# Patient Record
Sex: Female | Born: 1960 | Race: White | Hispanic: No | Marital: Married | Smoking: Former smoker
Health system: Southern US, Community
[De-identification: ages and names within clinical notes are randomized; demographics above are authoritative.]

## PROBLEM LIST (undated history)

## (undated) DIAGNOSIS — J3800 Paralysis of vocal cords and larynx, unspecified: Secondary | ICD-10-CM

## (undated) DIAGNOSIS — Z5189 Encounter for other specified aftercare: Secondary | ICD-10-CM

## (undated) DIAGNOSIS — I639 Cerebral infarction, unspecified: Secondary | ICD-10-CM

## (undated) HISTORY — DX: Cerebral infarction, unspecified: I63.9

## (undated) HISTORY — PX: COLONOSCOPY: SHX174

## (undated) HISTORY — DX: Encounter for other specified aftercare: Z51.89

## (undated) HISTORY — PX: OOPHORECTOMY: SHX86

## (undated) HISTORY — PX: OTHER SURGICAL HISTORY: SHX169

## (undated) HISTORY — DX: Paralysis of vocal cords and larynx, unspecified: J38.00

---

## 2008-04-06 LAB — HM COLONOSCOPY: HM Colonoscopy: NORMAL

## 2014-06-25 LAB — HM MAMMOGRAPHY

## 2014-07-02 LAB — BASIC METABOLIC PANEL
BUN: 8 mg/dL (ref 4–21)
Creatinine: 0.6 mg/dL (ref <=1.1)
GLUCOSE: 83 mg/dL
Potassium: 4.5 mmol/L (ref 3.4–5.3)
Sodium: 142 mmol/L (ref 137–147)

## 2014-07-02 LAB — LIPID PANEL
Cholesterol: 172 mg/dL (ref 0–200)
HDL: 67 mg/dL (ref 35–70)
LDL CALC: 93 mg/dL
Triglycerides: 60 mg/dL (ref 40–160)

## 2014-07-02 LAB — CBC AND DIFFERENTIAL
HEMATOCRIT: 39 % (ref 36–46)
HEMOGLOBIN: 12.1 g/dL (ref 12.0–16.0)
Platelets: 155 10*3/uL (ref 150–399)
WBC: 4.3 10*3/mL

## 2014-10-29 ENCOUNTER — Ambulatory Visit (INDEPENDENT_AMBULATORY_CARE_PROVIDER_SITE_OTHER): Payer: PRIVATE HEALTH INSURANCE | Admitting: Emergency Medicine

## 2014-10-29 VITALS — BP 96/64 | HR 81 | Temp 98.8°F | Resp 16 | Ht 63.0 in | Wt 125.2 lb

## 2014-10-29 DIAGNOSIS — J209 Acute bronchitis, unspecified: Secondary | ICD-10-CM

## 2014-10-29 MED ORDER — HYDROCOD POLST-CPM POLST ER 10-8 MG/5ML PO SUER: 5.0000 mL | Freq: Two times a day (BID) | ORAL | Status: DC

## 2014-10-29 MED ORDER — CLARITHROMYCIN 250 MG/5ML PO SUSR: 500.0000 mg | Freq: Two times a day (BID) | ORAL | Status: DC

## 2014-10-29 NOTE — Progress Notes (Signed)
Subjective:  Patient ID: Diane Stone, female    DOB: [birth year]  Age: 54 y.o. MRN: [id number]  CC: Cough   HPI Diane Stone presents  with a cough. She has a fever and feels all severe malaise and fatigue. She has no wheezing or shortness of breath. She has cough productive purulent sputum. Nasal congestion is watery in character. 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 Difficulty swallowing and handling secretions. She denies any nausea vomiting or stool change.  History Diane Stone has a past medical history of Stroke [facility]).   She has past surgical history that includes Cesarean section and Oophorectomy (N/A).   Her  family history is not on file.  She   reports that she has never smoked. She does not have any smokeless tobacco history on file. Her alcohol and drug histories are not on file.  No outpatient prescriptions prior to visit.   No facility-administered medications prior to visit.    Social History   Social History  . Marital Status: Married    Spouse Name: N/A  . Number of Children: N/A  . Years of Education: N/A   Social History Main Topics  . Smoking status: Never Smoker   . Smokeless tobacco: None  . Alcohol Use: None  . Drug Use: None  . Sexual Activity: Not Asked   Other Topics Concern  . None   Social History Narrative  . None     Review of Systems  Constitutional: Positive for chills and fatigue. Negative for fever and appetite change.  HENT: Positive for rhinorrhea and voice change. Negative for congestion, ear pain, postnasal drip, sinus pressure and sore throat.   Eyes: Negative for pain and redness.  Respiratory: Positive for cough. Negative for shortness of breath and wheezing.   Cardiovascular: Negative for leg swelling.  Gastrointestinal: Negative for nausea, vomiting, abdominal pain, diarrhea, constipation and blood in stool.  Endocrine: Negative for  polyuria.  Genitourinary: Negative for dysuria, urgency, frequency and flank pain.  Musculoskeletal: Negative for gait problem.  Skin: Negative for rash.  Neurological: Negative for weakness and headaches.  Psychiatric/Behavioral: Negative for confusion and decreased concentration. The patient is not nervous/anxious.     Objective:  BP 96/64 mmHg  Pulse 81  Temp(Src) 98.8 F (37.1 C) (Oral)  Resp 16  Ht  (1.6 m)  Wt 125 lb 3.2 oz (56.79 kg)  BMI 22.18 kg/m2  SpO2 98%  LMP 10/11/2014  Physical Exam  Constitutional: She is oriented to person, place, and time. She appears well-developed and well-nourished. No distress.  HENT:  Head: Normocephalic and atraumatic.  Right Ear: External ear normal.  Left Ear: External ear normal.  Nose: Nose normal.  Eyes: Conjunctivae and EOM are normal. Pupils are equal, round, and reactive to light. No scleral icterus.  Neck: Normal range of motion. Neck supple. No tracheal deviation present.  Cardiovascular: Normal rate, regular rhythm and normal heart sounds.   Pulmonary/Chest: Effort normal. No respiratory distress. She has no wheezes. She has no rales.  Abdominal: She exhibits no mass. There is no tenderness. There is no rebound and no guarding.  Musculoskeletal: She exhibits no edema.  Lymphadenopathy:    She has no cervical adenopathy.  Neurological: She is alert and oriented to person, place, and time. Coordination normal.  Skin: Skin is warm and dry. No rash noted.  Psychiatric: She has a normal mood and affect. Her behavior is normal.  Assessment & Plan:   Diane Stone seen today for cough.  Diagnoses and all orders for this visit:  Acute bronchitis, unspecified organism  Other orders -     chlorpheniramine-HYDROcodone (TUSSIONEX PENNKINETIC ER) 10-8 MG/5ML SUER; Take 5 mLs by mouth 2 (two) times daily. -     Discontinue: clarithromycin (BIAXIN) 250 MG/5ML suspension; Take 10 mLs (500 mg total) by mouth 2 (two) times  daily.  I have discontinued Diane Stone clarithromycin. I am also having her start on chlorpheniramine-HYDROcodone. Additionally, I am having her maintain her aspirin EC, ferrous sulfate, and pediatric multivitamin-fluoride.  Meds ordered this encounter  Medications  . aspirin EC 81 MG tablet    Sig: Take 81 mg by mouth daily.  . ferrous sulfate 325 (65 FE) MG tablet    Sig: Take 325 mg by mouth daily with breakfast.  . pediatric multivitamin-fluoride (POLY-VI-FLOR) 0.25 MG chewable tablet    Sig: Chew 1 tablet by mouth daily.  . chlorpheniramine-HYDROcodone (TUSSIONEX PENNKINETIC ER) 10-8 MG/5ML SUER    Sig: Take 5 mLs by mouth 2 (two) times daily.    Dispense:  60 mL    Refill:  0  . DISCONTD: clarithromycin (BIAXIN) 250 MG/5ML suspension    Sig: Take 10 mLs (500 mg total) by mouth 2 (two) times daily.    Dispense:  200 mL    Refill:  0   Her prescription for Biaxin Stone canceled as the pharmacy doesn't have a liquid Biaxin so she Stone changed to Augmentin ES.  Appropriate red flag conditions were discussed with the patient as well as actions that should be taken.  Patient expressed his understanding.  Follow-up: Return if symptoms worsen or fail to improve.  Carmelina DaneAnderson, Jeffery S, MD

## 2014-10-29 NOTE — Patient Instructions (Signed)

## 2015-03-28 ENCOUNTER — Encounter: Payer: Self-pay | Admitting: General Practice

## 2015-03-29 ENCOUNTER — Ambulatory Visit (INDEPENDENT_AMBULATORY_CARE_PROVIDER_SITE_OTHER): Payer: PRIVATE HEALTH INSURANCE | Admitting: Family Medicine

## 2015-03-29 ENCOUNTER — Encounter: Payer: Self-pay | Admitting: Family Medicine

## 2015-03-29 VITALS — BP 100/72 | HR 74 | Temp 98.2°F | Resp 16 | Ht 63.0 in | Wt 122.5 lb

## 2015-03-29 DIAGNOSIS — H9192 Unspecified hearing loss, left ear: Secondary | ICD-10-CM

## 2015-03-29 DIAGNOSIS — I63231 Cerebral infarction due to unspecified occlusion or stenosis of right carotid arteries: Secondary | ICD-10-CM | POA: Diagnosis not present

## 2015-03-29 DIAGNOSIS — I63239 Cerebral infarction due to unspecified occlusion or stenosis of unspecified carotid arteries: Secondary | ICD-10-CM | POA: Insufficient documentation

## 2015-03-29 DIAGNOSIS — Z800 Family history of malignant neoplasm of digestive organs: Secondary | ICD-10-CM | POA: Diagnosis not present

## 2015-03-29 DIAGNOSIS — J3800 Paralysis of vocal cords and larynx, unspecified: Secondary | ICD-10-CM | POA: Diagnosis not present

## 2015-03-29 NOTE — Progress Notes (Signed)
Pre visit review using our clinic review tool, if applicable. No additional management support is needed unless otherwise documented below in the visit note. 

## 2015-03-29 NOTE — Progress Notes (Signed)
   Subjective:    Patient ID: Diane Stone, female    DOB: 1960-02-19, 55 y.o.   MRN: [id number]  HPI New to establish.  Previous MD- [place]  Last CPE July 2016.  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  Schwannoma was completely removed.  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  Family hx of colon cancer- sister, dx'd at age 54.  Pt's last colonoscopy was 6-7 yrs ago.  Needs repeat.  GYN- UTD on pap (no longer having these done), mammo.  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  Notes progressively diminishing hearing on this side.   Review of Systems For ROS see HPI     Objective:   Physical Exam  Constitutional: She is oriented to person, place, and time. She appears well-developed and well-nourished. No distress.  HENT:  Head: Normocephalic and atraumatic.  Nose: Nose normal.  Mouth/Throat: Oropharynx is clear and moist. No oropharyngeal exudate.  TMs WNL bilaterally  Eyes: Conjunctivae and EOM are normal. Pupils are equal, round, and reactive to light.  Neck: Normal range of motion. Neck supple. No thyromegaly present.  Cardiovascular: Normal rate, regular rhythm, normal heart sounds and intact distal pulses.   No murmur heard. Pulmonary/Chest: Effort normal and breath sounds normal. No respiratory distress.  Abdominal: Soft. She exhibits no distension. There is no tenderness.  Musculoskeletal: She exhibits no edema.  Lymphadenopathy:    She has no cervical adenopathy.  Neurological: She is alert and oriented to person, place, and time.  Chronic hoarseness  Skin: Skin is warm and dry.  Psychiatric: She has a normal mood and affect. Her behavior is normal.  Vitals reviewed.         Assessment & Plan:

## 2015-03-29 NOTE — Addendum Note (Signed)
Addended by: Sheliah HatchABORI, Ayush Boulet E on: 03/29/2015 11:52 AM   Modules accepted: Level of Service

## 2015-03-29 NOTE — Assessment & Plan Note (Signed)
New.  May be residual from CVA.  Discussed that there appears to be a component of allergic eustachian tube dysfunction and recommended daily Claritin/Zyrtec.  Will refer to ENT for complete evaluation.

## 2015-03-29 NOTE — Assessment & Plan Note (Signed)
Occurred in 2010 during the removal of vestibular schwannoma.  Pt has residual vocal cord paralysis and mild L sided weakness but is otherwise doing well.

## 2015-03-29 NOTE — Assessment & Plan Note (Signed)
New.  Pt's sister was dx'd at age 54.  Pt is due for repeat colonoscopy.  She will need alternative prep b/c she is not able to drink large amounts of liquids since her CVA left her w/ vocal cord paralysis.  Referral placed.

## 2015-03-29 NOTE — Assessment & Plan Note (Signed)
New to provider, ongoing for pt since CVA in 2010.  Speaks in very hoarse voice and has difficulty w/ large amounts of liquids but otherwise has no need for modifications.

## 2015-03-29 NOTE — Patient Instructions (Signed)
Schedule your complete physical in 6 months We'll call you with your ENT and GI appts Keep up the good work!  You look great!!! Call with any questions or concerns Welcome!  We're glad to have you!!!

## 2015-03-30 ENCOUNTER — Telehealth: Payer: Self-pay | Admitting: Family Medicine

## 2015-03-30 NOTE — Telephone Encounter (Signed)
Pt asking what could she take for a decongestant in liquid form or a smaller pill that could be crushed. She is unable to swallow pills.

## 2015-03-30 NOTE — Telephone Encounter (Signed)
Zyrtec is a small pill but also comes in chewable and liquid form.

## 2015-03-30 NOTE — Telephone Encounter (Signed)
Pt informed and expressed an understanding.,

## 2015-04-07 ENCOUNTER — Encounter: Payer: Self-pay | Admitting: General Practice

## 2015-09-26 ENCOUNTER — Encounter: Payer: PRIVATE HEALTH INSURANCE | Admitting: Family Medicine

## 2017-05-21 ENCOUNTER — Encounter: Payer: Self-pay | Admitting: General Practice

## 2017-06-24 ENCOUNTER — Telehealth: Payer: Self-pay | Admitting: General Practice

## 2017-06-24 ENCOUNTER — Other Ambulatory Visit: Payer: Self-pay

## 2017-06-24 ENCOUNTER — Ambulatory Visit: Payer: PRIVATE HEALTH INSURANCE | Admitting: Family Medicine

## 2017-06-24 ENCOUNTER — Encounter: Payer: Self-pay | Admitting: Family Medicine

## 2017-06-24 VITALS — BP 112/78 | HR 64 | Temp 97.9°F | Resp 16 | Ht 63.0 in | Wt 126.2 lb

## 2017-06-24 DIAGNOSIS — Z0000 Encounter for general adult medical examination without abnormal findings: Secondary | ICD-10-CM | POA: Diagnosis not present

## 2017-06-24 DIAGNOSIS — Z23 Encounter for immunization: Secondary | ICD-10-CM

## 2017-06-24 DIAGNOSIS — Z1231 Encounter for screening mammogram for malignant neoplasm of breast: Secondary | ICD-10-CM

## 2017-06-24 DIAGNOSIS — I63232 Cerebral infarction due to unspecified occlusion or stenosis of left carotid arteries: Secondary | ICD-10-CM | POA: Diagnosis not present

## 2017-06-24 DIAGNOSIS — J3800 Paralysis of vocal cords and larynx, unspecified: Secondary | ICD-10-CM

## 2017-06-24 DIAGNOSIS — Z800 Family history of malignant neoplasm of digestive organs: Secondary | ICD-10-CM | POA: Diagnosis not present

## 2017-06-24 NOTE — Assessment & Plan Note (Signed)
Pt is due for colonoscopy but needs alternative prep as she is not able to swallow large volumes given vocal cord paralysis.

## 2017-06-24 NOTE — Progress Notes (Signed)
   Subjective:    Patient ID: Diane Stone, female    DOB: Apr 12, 1960, 57 y.o.   MRN: [id number]  HPI Hx of CVA- pt suffered CVA during removal of vestibular schwannoma.  This resulted in vocal cord paralysis.  Pt reports feeling well.  Due for mammogram and pap.   Review of Systems Patient reports no vision/ hearing changes, adenopathy,fever, weight change, swallowing issues, chest pain, palpitations, edema, persistant/recurrent cough, hemoptysis, dyspnea (rest/exertional/paroxysmal nocturnal), gastrointestinal bleeding (melena, rectal bleeding), abdominal pain, significant heartburn, bowel changes, GU symptoms (dysuria, hematuria, incontinence), Gyn symptoms (abnormal  bleeding, pain),  syncope, focal weakness, memory loss, numbness & tingling, skin/hair/nail changes, abnormal bruising or bleeding, anxiety, or depression.     Objective:   Physical Exam General Appearance:    Alert, cooperative, no distress, appears stated age  Head:    Normocephalic, without obvious abnormality, atraumatic  Eyes:    PERRL, conjunctiva/corneas clear, EOM's intact, fundi    benign, both eyes  Ears:    Normal TM's and external ear canals, both ears  Nose:   Nares normal, septum midline, mucosa normal, no drainage    or sinus tenderness  Throat:   Lips, mucosa, and tongue normal; teeth and gums normal  Neck:   Supple, symmetrical, trachea midline, no adenopathy;    Thyroid: no enlargement/tenderness/nodules  Back:     Symmetric, no curvature, ROM normal, no CVA tenderness  Lungs:     Clear to auscultation bilaterally, respirations unlabored  Chest Wall:    No tenderness or deformity   Heart:    Regular rate and rhythm, S1 and S2 normal, no murmur, rub   or gallop  Breast Exam:    Deferred to mammo  Abdomen:     Soft, non-tender, bowel sounds active all four quadrants,    no masses, no organomegaly  Genitalia:    Deferred  Rectal:    Extremities:   Extremities normal, atraumatic, no cyanosis or  edema  Pulses:   2+ and symmetric all extremities  Skin:   Skin color, texture, turgor normal, no rashes or lesions  Lymph nodes:   Cervical, supraclavicular, and axillary nodes normal  Neurologic:   CNII-XII intact, normal strength, sensation and reflexes    throughout          Assessment & Plan:

## 2017-06-24 NOTE — Patient Instructions (Signed)
Schedule your pap at your convenience (or we can wait until your physical next year!) We'll notify you of your lab results and make any changes if needed We'll call you with your GI appt and mammogram appt Keep up the good work on healthy diet and regular exercise- you look great! Call with any questions or concerns Have a great summer!!

## 2017-06-24 NOTE — Assessment & Plan Note (Signed)
Chronic problem.  Unchanged from previous.

## 2017-06-24 NOTE — Telephone Encounter (Signed)
We do not have copies of this report in the chart. I am not sure who pt saw for this previously.   Copied from CRM [phone]. Topic: General - Other >> Jun 24, 2017  4:24 PM Stephannie LiSimmons, Janett L, NT wrote: Reason for CRM: [facility] called and needs a cop of the patients colonoscopy ,and pathology report faxed to [phone] please call [phone] opt 2

## 2017-06-24 NOTE — Assessment & Plan Note (Signed)
Pt's PE WNL.  Due for repeat colonoscopy, mammo- ordered.  Tdap given.  Will do pap at upcoming appt.  Check labs.  Anticipatory guidance provided.

## 2017-06-24 NOTE — Assessment & Plan Note (Signed)
This occurred during removal of vestibular schwannoma.  She has no elevated risk factors.  No need for ASA.  Will continue to follow.

## 2017-06-25 ENCOUNTER — Encounter: Payer: Self-pay | Admitting: General Practice

## 2017-06-25 LAB — CBC WITH DIFFERENTIAL/PLATELET
Basophils Absolute: 0 10*3/uL (ref 0.0–0.1)
Basophils Relative: 0.8 % (ref 0.0–3.0)
EOS ABS: 0 10*3/uL (ref 0.0–0.7)
Eosinophils Relative: 0.7 % (ref 0.0–5.0)
HCT: 36.4 % (ref 36.0–46.0)
HEMOGLOBIN: 11.9 g/dL — AB (ref 12.0–15.0)
LYMPHS ABS: 1.8 10*3/uL (ref 0.7–4.0)
Lymphocytes Relative: 42.6 % (ref 12.0–46.0)
MCHC: 32.7 g/dL (ref 30.0–36.0)
MCV: 75.3 fl — ABNORMAL LOW (ref 78.0–100.0)
MONO ABS: 0.5 10*3/uL (ref 0.1–1.0)
Monocytes Relative: 11.2 % (ref 3.0–12.0)
NEUTROS PCT: 44.7 % (ref 43.0–77.0)
Neutro Abs: 1.9 10*3/uL (ref 1.4–7.7)
Platelets: 155 10*3/uL (ref 150.0–400.0)
RBC: 4.83 Mil/uL (ref 3.87–5.11)
RDW: 20.6 % — ABNORMAL HIGH (ref 11.5–15.5)
WBC: 4.2 10*3/uL (ref 4.0–10.5)

## 2017-06-25 LAB — LIPID PANEL
Cholesterol: 183 mg/dL (ref 0–200)
HDL: 58.2 mg/dL
LDL Cholesterol: 110 mg/dL — ABNORMAL HIGH (ref 0–99)
NonHDL: 124.62
Total CHOL/HDL Ratio: 3
Triglycerides: 72 mg/dL (ref 0.0–149.0)
VLDL: 14.4 mg/dL (ref 0.0–40.0)

## 2017-06-25 LAB — BASIC METABOLIC PANEL WITH GFR
BUN: 15 mg/dL (ref 6–23)
CO2: 30 meq/L (ref 19–32)
Calcium: 9.5 mg/dL (ref 8.4–10.5)
Chloride: 103 meq/L (ref 96–112)
Creatinine, Ser: 0.57 mg/dL (ref 0.40–1.20)
GFR: 116.75 mL/min
Glucose, Bld: 94 mg/dL (ref 70–99)
Potassium: 4.2 meq/L (ref 3.5–5.1)
Sodium: 139 meq/L (ref 135–145)

## 2017-06-25 LAB — HEPATIC FUNCTION PANEL
ALBUMIN: 4.3 g/dL (ref 3.5–5.2)
ALK PHOS: 58 U/L (ref 39–117)
ALT: 26 U/L (ref 0–35)
AST: 24 U/L (ref 0–37)
Bilirubin, Direct: 0.1 mg/dL (ref 0.0–0.3)
TOTAL PROTEIN: 7.1 g/dL (ref 6.0–8.3)
Total Bilirubin: 0.4 mg/dL (ref 0.2–1.2)

## 2017-06-25 LAB — TSH: TSH: 1.02 u[IU]/mL (ref 0.35–4.50)

## 2017-06-25 NOTE — Telephone Encounter (Signed)
Called and spoke to pt, she states that she will call her doctor from [place] and have them fax over her last colonoscopy.

## 2017-06-28 ENCOUNTER — Telehealth: Payer: Self-pay | Admitting: Gastroenterology

## 2017-07-09 NOTE — Telephone Encounter (Signed)
Regina,  This pt is cleared for anesthetic care at [facility].  Thanks,  Florencia Zaccaro 

## 2017-07-09 NOTE — Telephone Encounter (Signed)
I have reviewed the colonoscopy report. It was done on 04/11/2009 by Dr. Kathe MarinerNawaz in [place].  There was moderate amount of fecal material making the procedure somewhat suboptimal.  I think it would be reasonable to get a repeat colonoscopy after a 2-day preparation.  Please set up for that.  Please make sure that she qualifies for [facility] (with [facility] citeria)

## 2017-07-09 NOTE — Telephone Encounter (Signed)
Patient history includes CVA and vocal cord paralysis that occurred during a removal of vestibular schwannoma. Does this impact her ability to have procedure at [facility]? Please, advise.

## 2017-07-10 NOTE — Telephone Encounter (Signed)
Spoke with patient and scheduled pre visit on 07/12/17 at 10:00 AM and prop colonoscopy on 08/02/17 at 2:00 PM.

## 2017-07-12 ENCOUNTER — Other Ambulatory Visit: Payer: Self-pay

## 2017-07-12 ENCOUNTER — Ambulatory Visit (AMBULATORY_SURGERY_CENTER): Payer: Self-pay | Admitting: *Deleted

## 2017-07-12 VITALS — Ht 64.0 in | Wt 125.0 lb

## 2017-07-12 DIAGNOSIS — Z800 Family history of malignant neoplasm of digestive organs: Secondary | ICD-10-CM

## 2017-07-12 MED ORDER — SOD PICOSULFATE-MAG OX-CIT ACD 10-3.5-12 MG-GM -GM/160ML PO SOLN: 1.0000 | ORAL | 0 refills | Status: DC

## 2017-07-12 NOTE — Progress Notes (Signed)
No egg or soy allergy known to patient  No issues with past sedation with any surgeries  or procedures, no intubation problems  No diet pills per patient No home 02 use per patient  No blood thinners per patient  Pt denies issues with constipation  No A fib or A flutter  EMMI video sent to pt's e mail - pt declined  Clenpiq coupon to pt pay as little as $40  Pt has vocal cord paralysis- see TE from [facility] and [facility]- ok for [facility]- Dr Chales AbrahamsGupta wanted a 2 day prep - pt states she has to drink slow and has to have thicker liquids like apple juice- we discussed appropriate liquids during her prep- husband in [place] with pt today- encouraged to call with further questions

## 2017-07-16 ENCOUNTER — Ambulatory Visit: Payer: PRIVATE HEALTH INSURANCE

## 2017-07-19 ENCOUNTER — Ambulatory Visit
Admission: RE | Admit: 2017-07-19 | Discharge: 2017-07-19 | Disposition: A | Discharge status: Home / Self Care | Payer: PRIVATE HEALTH INSURANCE | Source: Ambulatory Visit | Attending: Family Medicine | Admitting: Family Medicine

## 2017-07-19 DIAGNOSIS — Z1231 Encounter for screening mammogram for malignant neoplasm of breast: Secondary | ICD-10-CM

## 2017-07-22 ENCOUNTER — Encounter: Payer: Self-pay | Admitting: General Practice

## 2017-08-01 ENCOUNTER — Encounter: Payer: Self-pay | Admitting: Family Medicine

## 2017-08-02 ENCOUNTER — Encounter: Payer: Self-pay | Admitting: Gastroenterology

## 2017-08-02 ENCOUNTER — Ambulatory Visit (AMBULATORY_SURGERY_CENTER): Payer: PRIVATE HEALTH INSURANCE | Admitting: Gastroenterology

## 2017-08-02 VITALS — BP 105/67 | HR 67 | Temp 98.6°F | Resp 17 | Ht 63.0 in | Wt 126.0 lb

## 2017-08-02 DIAGNOSIS — Z1211 Encounter for screening for malignant neoplasm of colon: Secondary | ICD-10-CM

## 2017-08-02 DIAGNOSIS — Z800 Family history of malignant neoplasm of digestive organs: Secondary | ICD-10-CM

## 2017-08-02 MED ORDER — SODIUM CHLORIDE 0.9 % IV SOLN: 500.0000 mL | Freq: Once | INTRAVENOUS | Status: DC

## 2017-08-02 NOTE — Op Note (Addendum)
[facility] Patient Name: Diane Stone Procedure Date: 08/02/2017 1:40 PM MRN: [id number] Endoscopist: Lynann Bologna , MD Age: 57 Referring MD:  Date of Birth: 06-Jan-1960 Gender: Female Account #: [id number] Procedure:                Colonoscopy Indications:              Screening in patient at increased risk: Colorectal                            cancer in sister before age 35 Medicines:                Monitored Anesthesia Care Procedure:                Pre-Anesthesia Assessment:                           - Prior to the procedure, a History and Physical                            was performed, and patient medications and                            allergies were reviewed. The patient's tolerance of                            previous anesthesia was also reviewed. The risks                            and benefits of the procedure and the sedation                            options and risks were discussed with the patient.                            All questions were answered, and informed consent                            was obtained. Prior Anticoagulants: The patient has                            taken no previous anticoagulant or antiplatelet                            agents. ASA Grade Assessment: II - A patient with                            mild systemic disease. After reviewing the risks                            and benefits, the patient was deemed in                            satisfactory condition to undergo the procedure.  After obtaining informed consent, the colonoscope                            was passed under direct vision. Throughout the                            procedure, the patient's blood pressure, pulse, and                            oxygen saturations were monitored continuously. The                            Colonoscope was introduced through the anus and                            advanced to the the cecum,  identified by                            appendiceal orifice and ileocecal valve. The                            colonoscopy was performed with moderate difficulty                            due to a redundant colon. Successful completion of                            the procedure was aided by applying abdominal                            pressure. The patient tolerated the procedure well.                            The quality of the bowel preparation was good. Scope In: 1:46:28 PM Scope Out: 2:06:47 PM Scope Withdrawal Time: 0 hours 7 minutes 59 seconds  Total Procedure Duration: 0 hours 20 minutes 19 seconds  Findings:                 Non-bleeding internal hemorrhoids were found during                            retroflexion. The hemorrhoids were small.                           The exam was otherwise without abnormality. The                            Colon was highly redundant. Complications:            No immediate complications. Estimated Blood Loss:     Estimated blood loss: none. Impression:               - Non-bleeding internal hemorrhoids.                           - The  examination was otherwise normal.                           - Highly redundant colon. Recommendation:           - Patient has a contact number available for                            emergencies. The signs and symptoms of potential                            delayed complications were discussed with the                            patient. Return to normal activities tomorrow.                            Written discharge instructions were provided to the                            patient.                           - Resume previous diet.                           - Continue present medications.                           - Repeat colonoscopy in 3 years for screening                            purposes.                           - Return to GI clinic PRN. Lynann Bologna, MD 08/02/2017 2:14:45 PM This report  has been signed electronically.

## 2017-08-02 NOTE — Patient Instructions (Addendum)
Thank you for allowing us to care for you today!  Resume previous diet and medications.  Follow up screening colonoscopy in three (3) years.       YOU HAD AN ENDOSCOPIC PROCEDURE TODAY AT THE [facility]:   Refer to the procedure report that was given to you for any specific questions about what was found during the examination.  If the procedure report does not answer your questions, please call your gastroenterologist to clarify.  If you requested that your care partner not be given the details of your procedure findings, then the procedure report has been included in a sealed envelope for you to review at your convenience later.  YOU SHOULD EXPECT: Some feelings of bloating in the abdomen. Passage of more gas than usual.  Walking can help get rid of the air that was put into your GI tract during the procedure and reduce the bloating. If you had a lower endoscopy (such as a colonoscopy or flexible sigmoidoscopy) you may notice spotting of blood in your stool or on the toilet paper. If you underwent a bowel prep for your procedure, you may not have a normal bowel movement for a few days.  Please Note:  You might notice some irritation and congestion in your nose or some drainage.  This is from the oxygen used during your procedure.  There is no need for concern and it should clear up in a day or so.  SYMPTOMS TO REPORT IMMEDIATELY:   Following lower endoscopy (colonoscopy or flexible sigmoidoscopy):  Excessive amounts of blood in the stool  Significant tenderness or worsening of abdominal pains  Swelling of the abdomen that is new, acute  Fever of 100F or higher    For urgent or emergent issues, a gastroenterologist can be reached at any hour by calling [phone].   DIET:  We do recommend a small meal at first, but then you may proceed to your regular diet.  Drink plenty of fluids but you should avoid alcoholic beverages for 24 hours.  ACTIVITY:  You should plan  to take it easy for the rest of today and you should NOT DRIVE or use heavy machinery until tomorrow (because of the sedation medicines used during the test).    FOLLOW UP: Our staff will call the number listed on your records the next business day following your procedure to check on you and address any questions or concerns that you may have regarding the information given to you following your procedure. If we do not reach you, we will leave a message.  However, if you are feeling well and you are not experiencing any problems, there is no need to return our call.  We will assume that you have returned to your regular daily activities without incident.  If any biopsies were taken you will be contacted by phone or by letter within the next 1-3 weeks.  Please call us at [phone] if you have not heard about the biopsies in 3 weeks.    SIGNATURES/CONFIDENTIALITY: You and/or your care partner have signed paperwork which will be entered into your electronic medical record.  These signatures attest to the fact that that the information above on your After Visit Summary has been reviewed and is understood.  Full responsibility of the confidentiality of this discharge information lies with you and/or your care-partner.

## 2017-08-02 NOTE — Progress Notes (Signed)
Pt. Reports no change in her medical or surgical history since her pre-visit 07/22/2017.

## 2017-08-05 ENCOUNTER — Telehealth: Payer: Self-pay | Admitting: *Deleted

## 2017-08-05 NOTE — Telephone Encounter (Signed)
  Follow up Call-  Call back number 08/02/2017  Post procedure Call Back phone  # [phone]  Permission to leave phone message Yes  Some recent data might be hidden     Patient questions:  Do you have a fever, pain , or abdominal swelling? No. Pain Score  0 *  Have you tolerated food without any problems? Yes.    Have you been able to return to your normal activities? Yes.    Do you have any questions about your discharge instructions: Diet   No. Medications  No. Follow up visit  No.  Do you have questions or concerns about your Care? no  Actions: * If pain score is 4 or above: No action needed, pain <4.

## 2017-09-05 ENCOUNTER — Encounter

## 2017-09-05 ENCOUNTER — Encounter: Payer: PRIVATE HEALTH INSURANCE | Admitting: Family Medicine

## 2018-06-26 ENCOUNTER — Encounter: Payer: Self-pay | Admitting: Family Medicine

## 2018-06-26 ENCOUNTER — Other Ambulatory Visit: Payer: Self-pay

## 2018-06-26 ENCOUNTER — Ambulatory Visit (INDEPENDENT_AMBULATORY_CARE_PROVIDER_SITE_OTHER): Payer: PRIVATE HEALTH INSURANCE | Admitting: Family Medicine

## 2018-06-26 VITALS — BP 106/78 | HR 79 | Temp 97.9°F | Resp 16 | Ht 63.0 in | Wt 138.0 lb

## 2018-06-26 DIAGNOSIS — E785 Hyperlipidemia, unspecified: Secondary | ICD-10-CM | POA: Diagnosis not present

## 2018-06-26 DIAGNOSIS — Z0000 Encounter for general adult medical examination without abnormal findings: Secondary | ICD-10-CM | POA: Diagnosis not present

## 2018-06-26 NOTE — Assessment & Plan Note (Signed)
Pt's PE WNL w/ exception of weight gain.  UTD on mammogram and colonoscopy.  Due for pap but will defer due to COVID.  Check labs.  Anticipatory guidance provided.

## 2018-06-26 NOTE — Progress Notes (Signed)
   Subjective:    Patient ID: Diane Stone, female    DOB: [birth year], 58 y.o.   MRN: [id number]  HPI CPE- due for pap (will do at later date)  UTD on mammo, colonoscopy, immunizations.  +12 lbs.   Review of Systems Patient reports no vision/ hearing changes, adenopathy,fever, swallowing issues, chest pain, palpitations, edema, persistant/recurrent cough, hemoptysis, dyspnea (rest/exertional/paroxysmal nocturnal), gastrointestinal bleeding (melena, rectal bleeding), abdominal pain, significant heartburn, bowel changes, GU symptoms (dysuria, hematuria, incontinence), Gyn symptoms (abnormal  bleeding, pain),  syncope, focal weakness, memory loss, numbness & tingling, skin/hair/nail changes, abnormal bruising or bleeding, anxiety, or depression.     Objective:   Physical Exam General Appearance:    Alert, cooperative, no distress, appears stated age  Head:    Normocephalic, without obvious abnormality, atraumatic  Eyes:    PERRL, conjunctiva/corneas clear, EOM's intact, fundi    benign, both eyes  Ears:    Normal TM's and external ear canals, both ears  Nose:   Nares normal, septum midline, mucosa normal, no drainage    or sinus tenderness  Throat:   Lips, mucosa, and tongue normal; teeth and gums normal  Neck:   Supple, symmetrical, trachea midline, no adenopathy;    Thyroid: no enlargement/tenderness/nodules  Back:     Symmetric, no curvature, ROM normal, no CVA tenderness  Lungs:     Clear to auscultation bilaterally, respirations unlabored  Chest Wall:    No tenderness or deformity   Heart:    Regular rate and rhythm, S1 and S2 normal, no murmur, rub   or gallop  Breast Exam:    Deferred to mammo  Abdomen:     Soft, non-tender, bowel sounds active all four quadrants,    no masses, no organomegaly  Genitalia:    Deferred  Rectal:    Extremities:   Extremities normal, atraumatic, no cyanosis or edema  Pulses:   2+ and symmetric all extremities  Skin:   Skin color, texture, turgor  normal, no rashes or lesions  Lymph nodes:   Cervical, supraclavicular, and axillary nodes normal  Neurologic:   CNII-XII intact, normal strength, sensation and reflexes    throughout          Assessment & Plan:

## 2018-06-26 NOTE — Patient Instructions (Signed)
Follow up in 1 year- sooner if needed We'll notify you of your lab results and make any changes if needed Continue to work on healthy diet and regular exercise- you can do it! Schedule your mammogram when you get the reminder letter Call with any questions or concerns Stay Safe!!

## 2018-06-27 LAB — CBC WITH DIFFERENTIAL/PLATELET
Basophils Absolute: 0 10*3/uL (ref 0.0–0.1)
Basophils Relative: 0.5 % (ref 0.0–3.0)
Eosinophils Absolute: 0 10*3/uL (ref 0.0–0.7)
Eosinophils Relative: 0.7 % (ref 0.0–5.0)
HCT: 39.3 % (ref 36.0–46.0)
Hemoglobin: 13 g/dL (ref 12.0–15.0)
Lymphocytes Relative: 34.4 % (ref 12.0–46.0)
Lymphs Abs: 1.6 10*3/uL (ref 0.7–4.0)
MCHC: 33.2 g/dL (ref 30.0–36.0)
MCV: 86.6 fl (ref 78.0–100.0)
Monocytes Absolute: 0.4 10*3/uL (ref 0.1–1.0)
Monocytes Relative: 7.8 % (ref 3.0–12.0)
Neutro Abs: 2.6 10*3/uL (ref 1.4–7.7)
Neutrophils Relative %: 56.6 % (ref 43.0–77.0)
Platelets: 150 10*3/uL (ref 150.0–400.0)
RBC: 4.54 Mil/uL (ref 3.87–5.11)
RDW: 14.4 % (ref 11.5–15.5)
WBC: 4.6 10*3/uL (ref 4.0–10.5)

## 2018-06-27 LAB — HEPATIC FUNCTION PANEL
ALT: 11 U/L (ref 0–35)
AST: 14 U/L (ref 0–37)
Albumin: 4.5 g/dL (ref 3.5–5.2)
Alkaline Phosphatase: 50 U/L (ref 39–117)
Bilirubin, Direct: 0.1 mg/dL (ref 0.0–0.3)
Total Bilirubin: 0.5 mg/dL (ref 0.2–1.2)
Total Protein: 7.1 g/dL (ref 6.0–8.3)

## 2018-06-27 LAB — LIPID PANEL
Cholesterol: 203 mg/dL — ABNORMAL HIGH (ref 0–200)
HDL: 65.2 mg/dL (ref >39.00)
LDL Cholesterol: 127 mg/dL — ABNORMAL HIGH (ref 0–99)
NonHDL: 137.95
Total CHOL/HDL Ratio: 3
Triglycerides: 53 mg/dL (ref 0.0–149.0)
VLDL: 10.6 mg/dL (ref 0.0–40.0)

## 2018-06-27 LAB — BASIC METABOLIC PANEL
BUN: 11 mg/dL (ref 6–23)
CO2: 33 mEq/L — ABNORMAL HIGH (ref 19–32)
Calcium: 9.9 mg/dL (ref 8.4–10.5)
Chloride: 102 mEq/L (ref 96–112)
Creatinine, Ser: 0.63 mg/dL (ref 0.40–1.20)
GFR: 97.51 mL/min (ref >60.00)
Glucose, Bld: 89 mg/dL (ref 70–99)
Potassium: 4.4 mEq/L (ref 3.5–5.1)
Sodium: 141 mEq/L (ref 135–145)

## 2018-06-27 LAB — TSH: TSH: 1.09 u[IU]/mL (ref 0.35–4.50)

## 2018-07-28 ENCOUNTER — Encounter: Payer: Self-pay | Admitting: General Practice

## 2018-08-19 ENCOUNTER — Other Ambulatory Visit: Payer: Self-pay | Admitting: Family Medicine

## 2018-08-19 DIAGNOSIS — Z1231 Encounter for screening mammogram for malignant neoplasm of breast: Secondary | ICD-10-CM

## 2018-08-22 ENCOUNTER — Other Ambulatory Visit: Payer: Self-pay

## 2018-08-22 ENCOUNTER — Ambulatory Visit
Admission: RE | Admit: 2018-08-22 | Discharge: 2018-08-22 | Disposition: A | Discharge status: Home / Self Care | Payer: PRIVATE HEALTH INSURANCE | Source: Ambulatory Visit | Attending: Family Medicine | Admitting: Family Medicine

## 2018-08-22 DIAGNOSIS — Z1231 Encounter for screening mammogram for malignant neoplasm of breast: Secondary | ICD-10-CM

## 2018-09-01 ENCOUNTER — Encounter: Payer: Self-pay | Admitting: Family Medicine

## 2019-01-15 ENCOUNTER — Encounter: Payer: Self-pay | Admitting: Family Medicine

## 2019-04-28 ENCOUNTER — Encounter: Payer: Self-pay | Admitting: Family Medicine

## 2019-07-01 ENCOUNTER — Other Ambulatory Visit (HOSPITAL_COMMUNITY)
Admission: RE | Admit: 2019-07-01 | Discharge: 2019-07-01 | Disposition: A | Discharge status: Home / Self Care | Payer: Medicare HMO | Source: Ambulatory Visit | Attending: Family Medicine | Admitting: Family Medicine

## 2019-07-01 ENCOUNTER — Encounter: Payer: PRIVATE HEALTH INSURANCE | Admitting: Family Medicine

## 2019-07-01 ENCOUNTER — Encounter: Payer: Self-pay | Admitting: Family Medicine

## 2019-07-01 ENCOUNTER — Ambulatory Visit (INDEPENDENT_AMBULATORY_CARE_PROVIDER_SITE_OTHER): Payer: Medicare HMO | Admitting: Family Medicine

## 2019-07-01 ENCOUNTER — Other Ambulatory Visit: Payer: Self-pay

## 2019-07-01 VITALS — BP 120/79 | HR 72 | Temp 97.9°F | Resp 16 | Ht 63.0 in | Wt 132.5 lb

## 2019-07-01 DIAGNOSIS — Z0000 Encounter for general adult medical examination without abnormal findings: Secondary | ICD-10-CM

## 2019-07-01 DIAGNOSIS — I63231 Cerebral infarction due to unspecified occlusion or stenosis of right carotid arteries: Secondary | ICD-10-CM | POA: Diagnosis not present

## 2019-07-01 DIAGNOSIS — Z124 Encounter for screening for malignant neoplasm of cervix: Secondary | ICD-10-CM | POA: Diagnosis not present

## 2019-07-01 DIAGNOSIS — E785 Hyperlipidemia, unspecified: Secondary | ICD-10-CM | POA: Diagnosis not present

## 2019-07-01 DIAGNOSIS — Z1151 Encounter for screening for human papillomavirus (HPV): Secondary | ICD-10-CM | POA: Diagnosis not present

## 2019-07-01 DIAGNOSIS — Z780 Asymptomatic menopausal state: Secondary | ICD-10-CM | POA: Diagnosis not present

## 2019-07-01 NOTE — Patient Instructions (Signed)
Follow up in 1 year or as needed We'll notify you of your lab results and make any changes if needed Keep up the good work!  You look great! Call with any questions or concerns Have a great summer!!! 

## 2019-07-01 NOTE — Assessment & Plan Note (Signed)
Pt's PE WNL w/ exception of known vocal cord paralysis.  Pap done today.  UTD on mammo, immunizations, colonoscopy.  Check labs.  Anticipatory guidance provided.

## 2019-07-01 NOTE — Assessment & Plan Note (Signed)
Pt is concerned about possible plaque or occlusion of the good side (L).  Will get carotid dopplers to assess.

## 2019-07-01 NOTE — Progress Notes (Signed)
   Subjective:    Patient ID: Diane Stone, female    DOB: 05-29-1960, 59 y.o.   MRN: [id number]  HPI GYN- UTD on colonoscopy, mammo, Tdap.  Due for pap.  Pt has joined [employer] and is successfully losing weight.   Review of Systems Patient reports no vision/ hearing changes, adenopathy,fever, weight change, swallowing issues, chest pain, palpitations, edema, persistant/recurrent cough, hemoptysis, dyspnea (rest/exertional/paroxysmal nocturnal), gastrointestinal bleeding (melena, rectal bleeding), abdominal pain, significant heartburn, bowel changes, GU symptoms (dysuria, hematuria, incontinence), Gyn symptoms (abnormal  bleeding, pain),  syncope, focal weakness, memory loss, skin/hair/nail changes, abnormal bruising or bleeding, anxiety, or depression.   + chronic hoarsness due to vocal cord paralysis + numbness/tingling of L arm s/p CVA  This visit occurred during the SARS-CoV-2 public health emergency.  Safety protocols were in place, including screening questions prior to the visit, additional usage of staff PPE, and extensive cleaning of exam room while observing appropriate contact time as indicated for disinfecting solutions.       Objective:   Physical Exam  General Appearance:    Alert, cooperative, no distress, appears stated age  Head:    Normocephalic, without obvious abnormality, atraumatic  Eyes:    PERRL, conjunctiva/corneas clear, EOM's intact, fundi    benign, both eyes  Ears:    Normal TM's and external ear canals, both ears  Nose:  deferred due to COVID  Throat:   Neck:   Supple, symmetrical, trachea midline, no adenopathy;    Thyroid: no enlargement/tenderness/nodules  Back:     Symmetric, no curvature, ROM normal, no CVA tenderness  Lungs:     Clear to auscultation bilaterally, respirations unlabored  Chest Wall:    No tenderness or deformity   Heart:    Regular rate and rhythm, S1 and S2 normal, no murmur, rub   or gallop  Breast Exam:    No tenderness, masses,  or nipple abnormality  Abdomen:     Soft, non-tender, bowel sounds active all four quadrants,    no masses, no organomegaly  Genitalia:    External genitalia normal, cervix normal in appearance, no CMT, uterus in normal size and position, adnexa w/out mass or tenderness, mucosa pink and moist, no lesions or discharge present  Rectal:    Normal external appearance  Extremities:   Extremities normal, atraumatic, no cyanosis or edema  Pulses:   2+ and symmetric all extremities  Skin:   Skin color, texture, turgor normal, no rashes or lesions  Lymph nodes:   Cervical, supraclavicular, and axillary nodes normal  Neurologic:   CNII-XII intact, normal strength, sensation and reflexes    throughout          Assessment & Plan:

## 2019-07-02 LAB — CBC WITH DIFFERENTIAL/PLATELET
Basophils Absolute: 0 10*3/uL (ref 0.0–0.1)
Basophils Relative: 0.5 % (ref 0.0–3.0)
Eosinophils Absolute: 0 10*3/uL (ref 0.0–0.7)
Eosinophils Relative: 0.9 % (ref 0.0–5.0)
HCT: 39.6 % (ref 36.0–46.0)
Hemoglobin: 13.2 g/dL (ref 12.0–15.0)
Lymphocytes Relative: 37.4 % (ref 12.0–46.0)
Lymphs Abs: 1.9 10*3/uL (ref 0.7–4.0)
MCHC: 33.4 g/dL (ref 30.0–36.0)
MCV: 86.3 fl (ref 78.0–100.0)
Monocytes Absolute: 0.4 10*3/uL (ref 0.1–1.0)
Monocytes Relative: 8.2 % (ref 3.0–12.0)
Neutro Abs: 2.7 10*3/uL (ref 1.4–7.7)
Neutrophils Relative %: 53 % (ref 43.0–77.0)
Platelets: 154 10*3/uL (ref 150.0–400.0)
RBC: 4.59 Mil/uL (ref 3.87–5.11)
RDW: 14 % (ref 11.5–15.5)
WBC: 5 10*3/uL (ref 4.0–10.5)

## 2019-07-02 LAB — LIPID PANEL
Cholesterol: 184 mg/dL (ref 0–200)
HDL: 63 mg/dL (ref >39.00)
LDL Cholesterol: 109 mg/dL — ABNORMAL HIGH (ref 0–99)
NonHDL: 120.95
Total CHOL/HDL Ratio: 3
Triglycerides: 59 mg/dL (ref 0.0–149.0)
VLDL: 11.8 mg/dL (ref 0.0–40.0)

## 2019-07-02 LAB — BASIC METABOLIC PANEL
BUN: 11 mg/dL (ref 6–23)
CO2: 30 mEq/L (ref 19–32)
Calcium: 10 mg/dL (ref 8.4–10.5)
Chloride: 103 mEq/L (ref 96–112)
Creatinine, Ser: 0.69 mg/dL (ref 0.40–1.20)
GFR: 87.48 mL/min (ref >60.00)
Glucose, Bld: 91 mg/dL (ref 70–99)
Potassium: 4 mEq/L (ref 3.5–5.1)
Sodium: 141 mEq/L (ref 135–145)

## 2019-07-02 LAB — HEPATIC FUNCTION PANEL
ALT: 10 U/L (ref 0–35)
AST: 14 U/L (ref 0–37)
Albumin: 4.7 g/dL (ref 3.5–5.2)
Alkaline Phosphatase: 55 U/L (ref 39–117)
Bilirubin, Direct: 0.1 mg/dL (ref 0.0–0.3)
Total Bilirubin: 0.4 mg/dL (ref 0.2–1.2)
Total Protein: 7.1 g/dL (ref 6.0–8.3)

## 2019-07-02 LAB — TSH: TSH: 1.29 u[IU]/mL (ref 0.35–4.50)

## 2019-07-03 LAB — CYTOLOGY - PAP
Comment: NEGATIVE
Diagnosis: NEGATIVE
High risk HPV: NEGATIVE

## 2019-07-29 ENCOUNTER — Ambulatory Visit
Admission: RE | Admit: 2019-07-29 | Discharge: 2019-07-29 | Disposition: A | Discharge status: Home / Self Care | Payer: Medicare HMO | Source: Ambulatory Visit | Attending: Family Medicine | Admitting: Family Medicine

## 2019-07-29 DIAGNOSIS — I771 Stricture of artery: Secondary | ICD-10-CM | POA: Diagnosis not present

## 2019-07-29 DIAGNOSIS — I63233 Cerebral infarction due to unspecified occlusion or stenosis of bilateral carotid arteries: Secondary | ICD-10-CM | POA: Diagnosis not present

## 2019-07-29 DIAGNOSIS — I63231 Cerebral infarction due to unspecified occlusion or stenosis of right carotid arteries: Secondary | ICD-10-CM

## 2020-01-28 ENCOUNTER — Other Ambulatory Visit: Payer: Self-pay | Admitting: Family Medicine

## 2020-01-28 DIAGNOSIS — Z1231 Encounter for screening mammogram for malignant neoplasm of breast: Secondary | ICD-10-CM

## 2020-02-01 ENCOUNTER — Ambulatory Visit
Admission: RE | Admit: 2020-02-01 | Discharge: 2020-02-01 | Disposition: A | Discharge status: Home / Self Care | Payer: Medicare HMO | Source: Ambulatory Visit

## 2020-02-01 ENCOUNTER — Other Ambulatory Visit: Payer: Self-pay

## 2020-02-01 DIAGNOSIS — Z1231 Encounter for screening mammogram for malignant neoplasm of breast: Secondary | ICD-10-CM | POA: Diagnosis not present

## 2020-05-18 DIAGNOSIS — U071 COVID-19: Secondary | ICD-10-CM | POA: Diagnosis not present

## 2020-05-20 DIAGNOSIS — Z20822 Contact with and (suspected) exposure to covid-19: Secondary | ICD-10-CM | POA: Diagnosis not present

## 2020-06-29 ENCOUNTER — Encounter: Payer: Self-pay | Admitting: *Deleted

## 2020-07-05 ENCOUNTER — Encounter: Payer: Medicare HMO | Admitting: Family Medicine

## 2020-07-11 ENCOUNTER — Other Ambulatory Visit: Payer: Self-pay

## 2020-07-11 ENCOUNTER — Encounter: Payer: Self-pay | Admitting: Family Medicine

## 2020-07-11 ENCOUNTER — Ambulatory Visit (INDEPENDENT_AMBULATORY_CARE_PROVIDER_SITE_OTHER): Payer: Medicare HMO | Admitting: Family Medicine

## 2020-07-11 VITALS — BP 115/70 | HR 68 | Temp 98.0°F | Ht 62.5 in | Wt 138.6 lb

## 2020-07-11 DIAGNOSIS — E785 Hyperlipidemia, unspecified: Secondary | ICD-10-CM

## 2020-07-11 DIAGNOSIS — Z0000 Encounter for general adult medical examination without abnormal findings: Secondary | ICD-10-CM | POA: Diagnosis not present

## 2020-07-11 LAB — CBC WITH DIFFERENTIAL/PLATELET
Basophils Absolute: 0 10*3/uL (ref 0.0–0.1)
Basophils Relative: 0.1 % (ref 0.0–3.0)
Eosinophils Absolute: 0 10*3/uL (ref 0.0–0.7)
Eosinophils Relative: 0.9 % (ref 0.0–5.0)
HCT: 38.4 % (ref 36.0–46.0)
Hemoglobin: 12.7 g/dL (ref 12.0–15.0)
Lymphocytes Relative: 37.6 % (ref 12.0–46.0)
Lymphs Abs: 1.4 10*3/uL (ref 0.7–4.0)
MCHC: 33.2 g/dL (ref 30.0–36.0)
MCV: 86.2 fl (ref 78.0–100.0)
Monocytes Absolute: 0.3 10*3/uL (ref 0.1–1.0)
Monocytes Relative: 8.9 % (ref 3.0–12.0)
Neutro Abs: 2 10*3/uL (ref 1.4–7.7)
Neutrophils Relative %: 52.5 % (ref 43.0–77.0)
Platelets: 144 10*3/uL — ABNORMAL LOW (ref 150.0–400.0)
RBC: 4.45 Mil/uL (ref 3.87–5.11)
RDW: 14.4 % (ref 11.5–15.5)
WBC: 3.8 10*3/uL — ABNORMAL LOW (ref 4.0–10.5)

## 2020-07-11 LAB — TSH: TSH: 1.07 u[IU]/mL (ref 0.35–5.50)

## 2020-07-11 LAB — BASIC METABOLIC PANEL
BUN: 9 mg/dL (ref 6–23)
CO2: 29 mEq/L (ref 19–32)
Calcium: 9.9 mg/dL (ref 8.4–10.5)
Chloride: 104 mEq/L (ref 96–112)
Creatinine, Ser: 0.61 mg/dL (ref 0.40–1.20)
GFR: 98.34 mL/min (ref >60.00)
Glucose, Bld: 64 mg/dL — ABNORMAL LOW (ref 70–99)
Potassium: 4.5 mEq/L (ref 3.5–5.1)
Sodium: 141 mEq/L (ref 135–145)

## 2020-07-11 LAB — HEPATIC FUNCTION PANEL
ALT: 10 U/L (ref 0–35)
AST: 15 U/L (ref 0–37)
Albumin: 4.6 g/dL (ref 3.5–5.2)
Alkaline Phosphatase: 49 U/L (ref 39–117)
Bilirubin, Direct: 0.1 mg/dL (ref 0.0–0.3)
Total Bilirubin: 0.4 mg/dL (ref 0.2–1.2)
Total Protein: 7 g/dL (ref 6.0–8.3)

## 2020-07-11 LAB — LIPID PANEL
Cholesterol: 190 mg/dL (ref 0–200)
HDL: 64.3 mg/dL (ref >39.00)
LDL Cholesterol: 113 mg/dL — ABNORMAL HIGH (ref 0–99)
NonHDL: 125.84
Total CHOL/HDL Ratio: 3
Triglycerides: 65 mg/dL (ref 0.0–149.0)
VLDL: 13 mg/dL (ref 0.0–40.0)

## 2020-07-11 NOTE — Progress Notes (Signed)
   Subjective:    Patient ID: Diane Stone, female    DOB: [birth year], 60 y.o.   MRN: [id number]  HPI CPE- UTD on pap, mammo, colonoscopy (due for repeat this August), Tdap, COVID.  No concerns  Reviewed past medical, surgical, family and social histories.   Patient Care Team    Relationship Specialty Notifications Start End  Sheliah Hatch, MD PCP - General Family Medicine  03/30/15   Lynann Bologna, MD Consulting Physician Gastroenterology  07/11/20     Health Maintenance  Topic Date Due   Zoster Vaccines- Shingrix (1 of 2) Never done   COVID-19 Vaccine (3 - Booster for Pfizer series) 07/27/2020 (Originally 09/07/2019)   Hepatitis C Screening  07/11/2021 (Originally 10/24/1979)   HIV Screening  07/11/2021 (Originally 10/23/1976)   INFLUENZA VACCINE  08/01/2020   COLONOSCOPY (Pts 45-19yrs Insurance coverage will need to be confirmed)  08/02/2020   MAMMOGRAM  01/31/2022   PAP SMEAR-Modifier  07/01/2022   TETANUS/TDAP  06/25/2027   Pneumococcal Vaccine 21-25 Years old  Aged Out   HPV VACCINES  Aged Out      Review of Systems Patient reports no vision/ hearing changes, adenopathy,fever, persistant/recurrent hoarseness , swallowing issues, chest pain, palpitations, edema, persistant/recurrent cough, hemoptysis, dyspnea (rest/exertional/paroxysmal nocturnal), gastrointestinal bleeding (melena, rectal bleeding), abdominal pain, significant heartburn, bowel changes, GU symptoms (dysuria, hematuria, incontinence), Gyn symptoms (abnormal  bleeding, pain),  syncope, focal weakness, memory loss, numbness & tingling, skin/hair/nail changes, abnormal bruising or bleeding, anxiety, or depression.   + 5 lb weight gain  This visit occurred during the SARS-CoV-2 public health emergency.  Safety protocols were in place, including screening questions prior to the visit, additional usage of staff PPE, and extensive cleaning of exam room while observing appropriate contact time as indicated for  disinfecting solutions.      Objective:   Physical Exam General Appearance:    Alert, cooperative, no distress, appears stated age  Head:    Normocephalic, without obvious abnormality, atraumatic  Eyes:    PERRL, conjunctiva/corneas clear, EOM's intact, fundi    benign, both eyes  Ears:    Normal TM's and external ear canals, both ears  Nose:   Deferred due to COVID  Throat:   Neck:   Supple, symmetrical, trachea midline, no adenopathy;    Thyroid: no enlargement/tenderness/nodules  Back:     Symmetric, no curvature, ROM normal, no CVA tenderness  Lungs:     Clear to auscultation bilaterally, respirations unlabored  Chest Wall:    No tenderness or deformity   Heart:    Regular rate and rhythm, S1 and S2 normal, no murmur, rub   or gallop  Breast Exam:    Deferred to mammo  Abdomen:     Soft, non-tender, bowel sounds active all four quadrants,    no masses, no organomegaly  Genitalia:    Deferred  Rectal:    Extremities:   Extremities normal, atraumatic, no cyanosis or edema  Pulses:   2+ and symmetric all extremities  Skin:   Skin color, texture, turgor normal, no rashes or lesions  Lymph nodes:   Cervical, supraclavicular, and axillary nodes normal  Neurologic:   Vocal cord paralysis, otherwise CNs intact, normal strength, sensation and reflexes throughout          Assessment & Plan:

## 2020-07-11 NOTE — Assessment & Plan Note (Signed)
Pt's PE unchanged from previous and WNL w/ exception of vocal cord paralysis.  UTD on immunizations, pap, mammo, colonoscopy.  Check labs.  Anticipatory guidance provided.

## 2020-07-11 NOTE — Patient Instructions (Signed)
Follow up in 1 year or as needed We'll notify you of your lab results and make any changes if needed Keep up the good work on healthy diet and regular exercise- you look great! Call and schedule your repeat colonoscopy Call with any questions or concerns Have a great summer! ENJOY THE [place] AND THE BABY!!!

## 2020-10-07 ENCOUNTER — Encounter: Payer: Self-pay | Admitting: Gastroenterology

## 2020-12-19 ENCOUNTER — Encounter: Payer: Self-pay | Admitting: Family Medicine

## 2021-03-18 IMAGING — MG MM DIGITAL SCREENING BILAT W/ TOMO AND CAD
8 series · 9 of 24 positions shown · non-contrast
Comparison: Previous exam(s).

CLINICAL DATA: Screening.

EXAM:
DIGITAL SCREENING BILATERAL MAMMOGRAM WITH TOMO AND CAD

[L CC synth-2D]
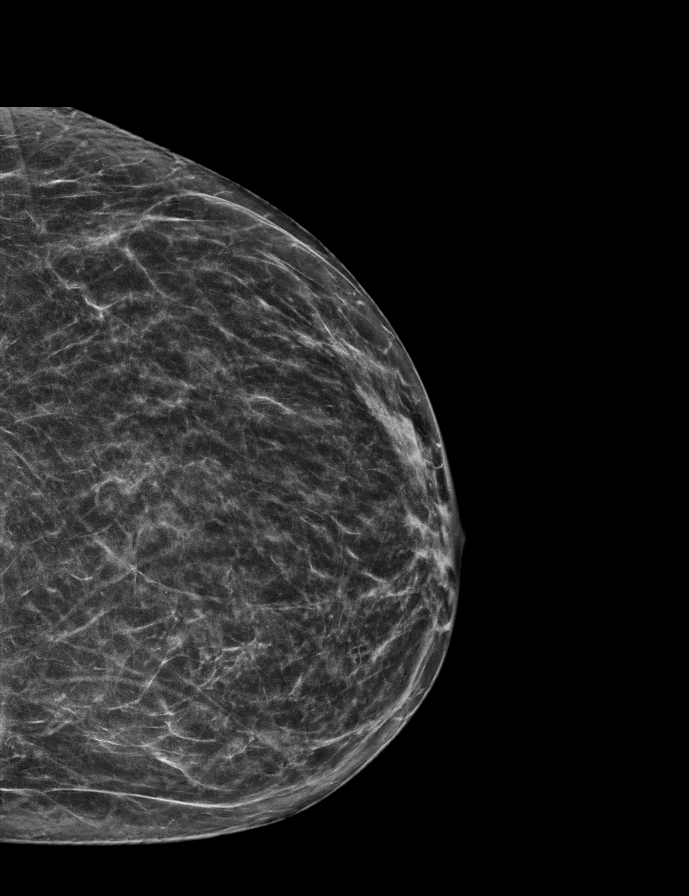

[R CC synth-2D]
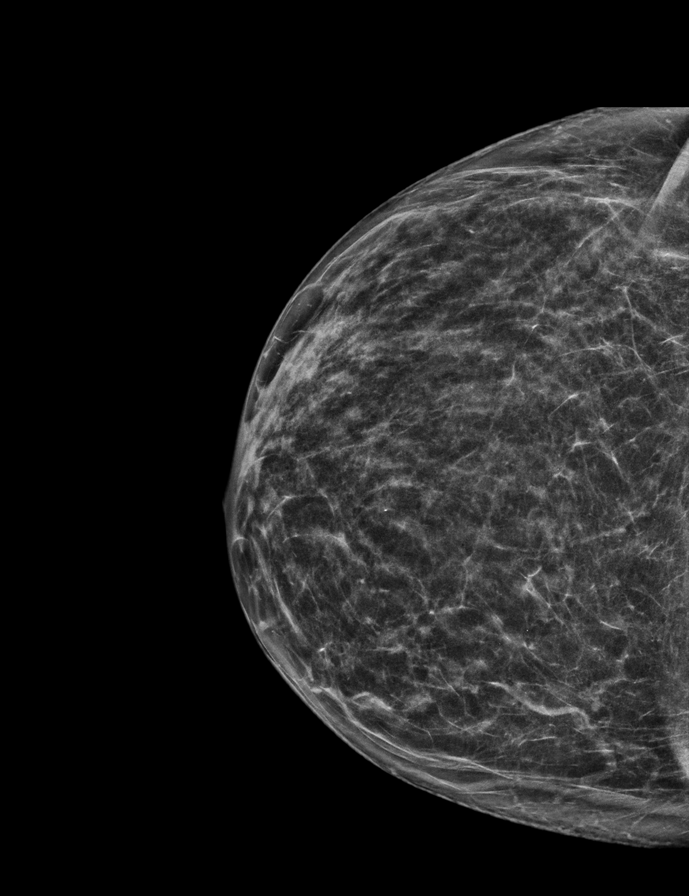

[L MLO synth-2D]
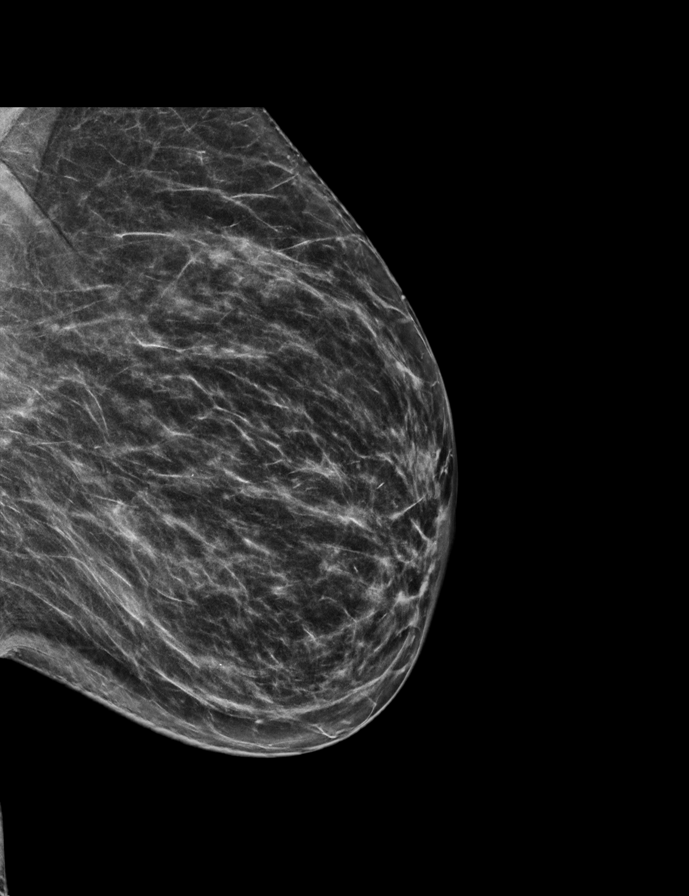

[R MLO synth-2D]
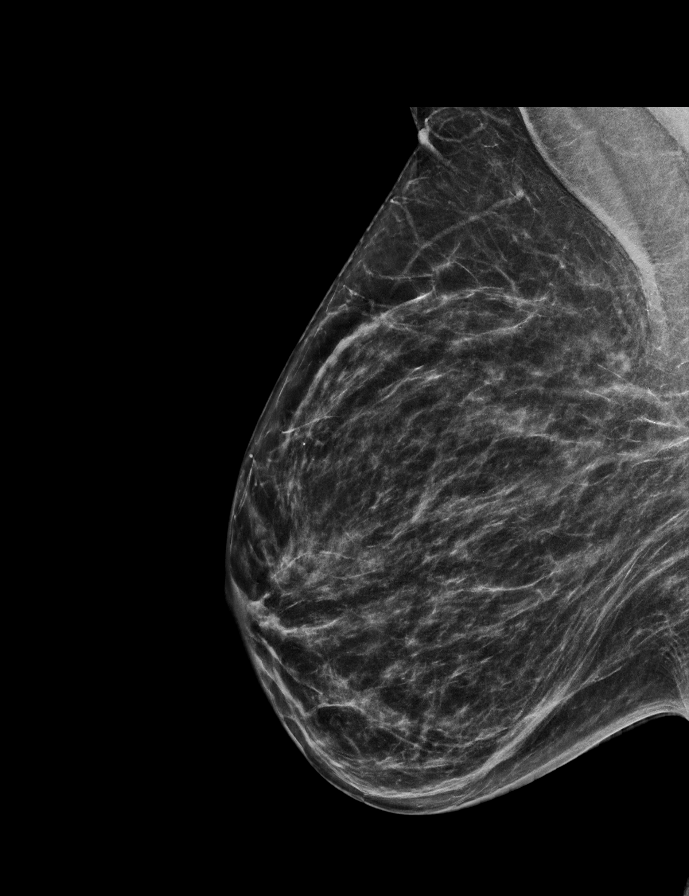

[R CC tomo · 2 of 55 frames shown]
[frame 18/55]
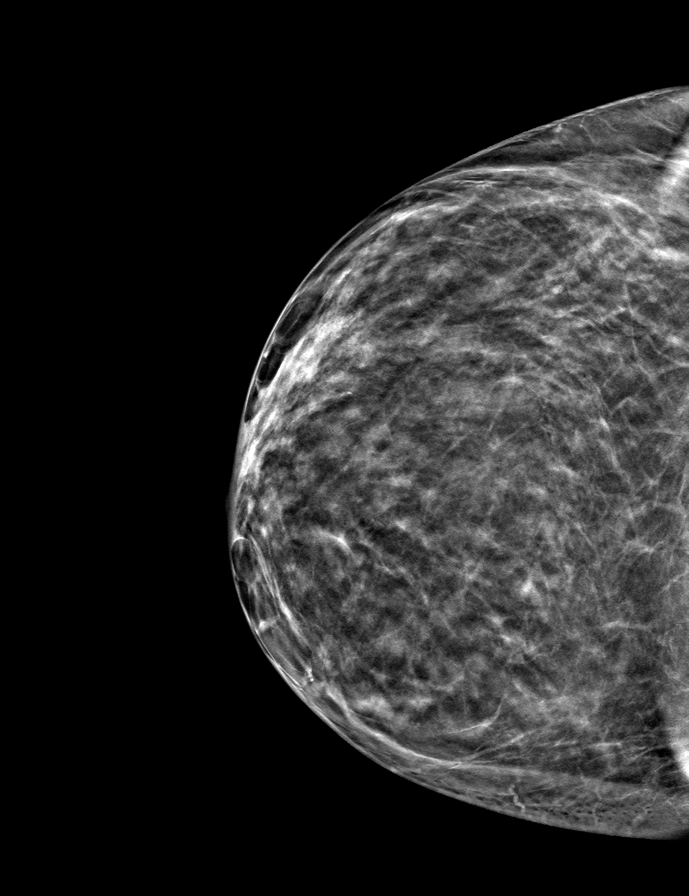
[frame 28/55]
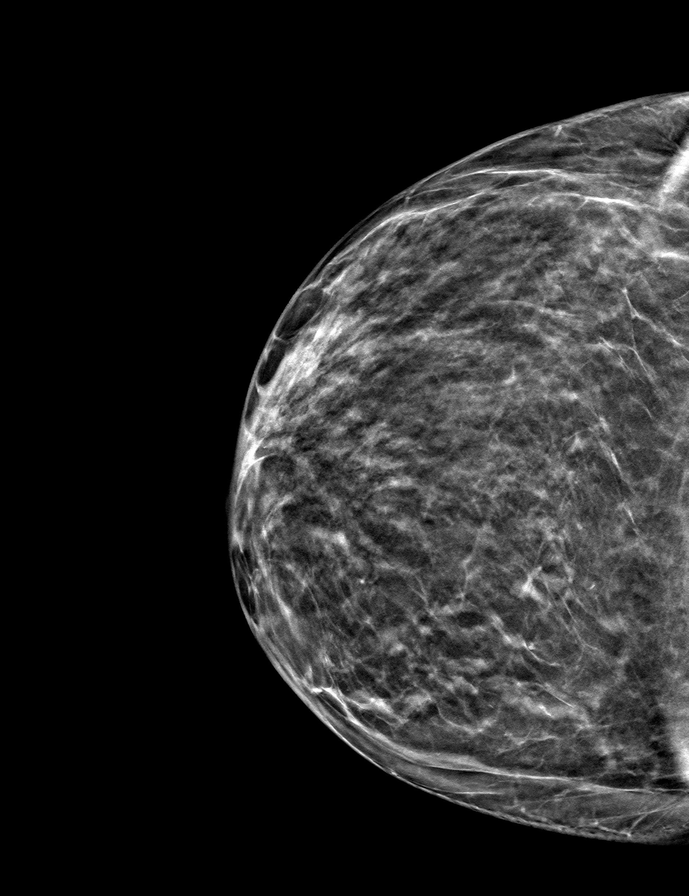

[L CC tomo · tomo slice 27/52.0]
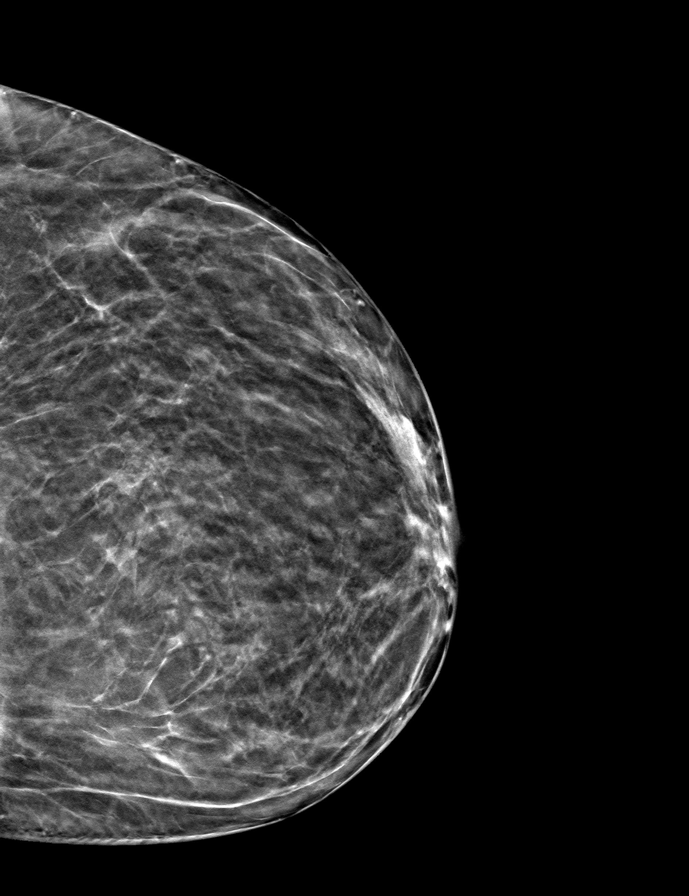

[R MLO tomo · tomo slice 31/62.0]
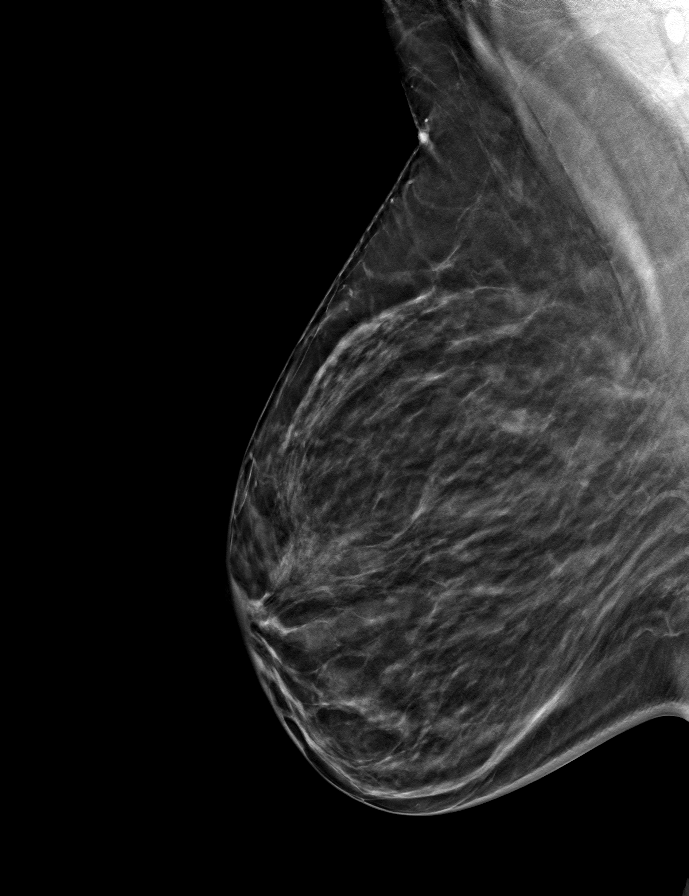

[L MLO tomo · tomo slice 31/60.0]
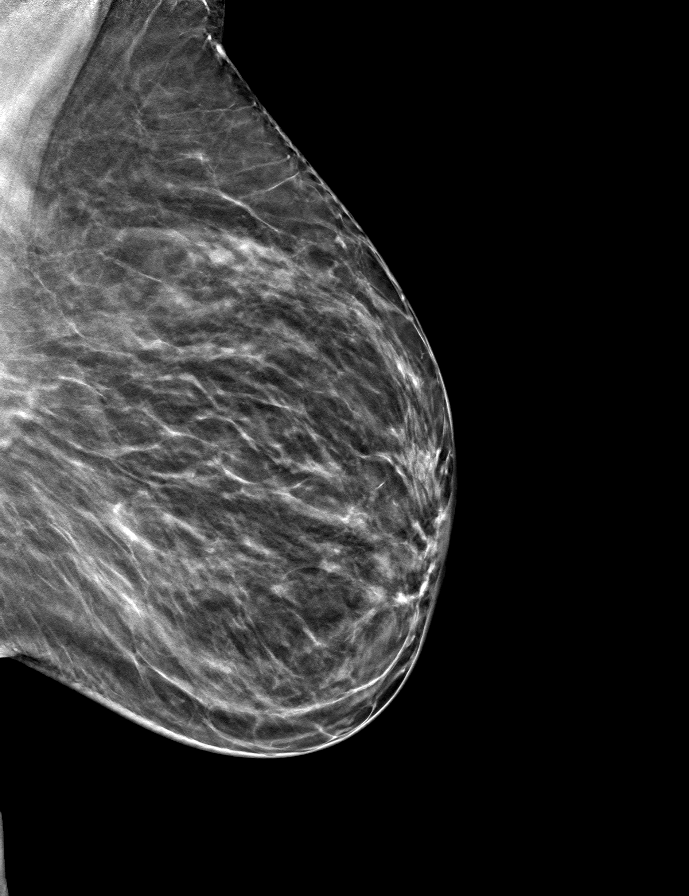

[9 of 24 positions shown; findings below may reference images not displayed]

ACR Breast Density Category b: There are scattered areas of
fibroglandular density.
FINDINGS: There are no findings suspicious for malignancy. The images were
evaluated with computer-aided detection.
IMPRESSION: No mammographic evidence of malignancy. A result letter of this
screening mammogram will be mailed directly to the patient.

RECOMMENDATION:
Screening mammogram in one year. (Code:[id number]-[id number])

BI-RADS CATEGORY  1: Negative.

## 2021-07-13 ENCOUNTER — Encounter: Payer: 59 | Admitting: Family Medicine

## 2021-07-20 ENCOUNTER — Encounter: Payer: Self-pay | Admitting: Family Medicine

## 2021-07-20 ENCOUNTER — Ambulatory Visit (INDEPENDENT_AMBULATORY_CARE_PROVIDER_SITE_OTHER): Payer: Medicare HMO | Admitting: Family Medicine

## 2021-07-20 VITALS — BP 116/72 | HR 65 | Temp 97.6°F | Resp 16 | Ht 62.0 in | Wt 139.4 lb

## 2021-07-20 DIAGNOSIS — Z0000 Encounter for general adult medical examination without abnormal findings: Secondary | ICD-10-CM | POA: Diagnosis not present

## 2021-07-20 DIAGNOSIS — J3800 Paralysis of vocal cords and larynx, unspecified: Secondary | ICD-10-CM | POA: Diagnosis not present

## 2021-07-20 DIAGNOSIS — E663 Overweight: Secondary | ICD-10-CM

## 2021-07-20 LAB — HEPATIC FUNCTION PANEL
ALT: 13 U/L (ref 0–35)
AST: 18 U/L (ref 0–37)
Albumin: 4.7 g/dL (ref 3.5–5.2)
Alkaline Phosphatase: 49 U/L (ref 39–117)
Bilirubin, Direct: 0.1 mg/dL (ref 0.0–0.3)
Total Bilirubin: 0.5 mg/dL (ref 0.2–1.2)
Total Protein: 7.5 g/dL (ref 6.0–8.3)

## 2021-07-20 LAB — CBC WITH DIFFERENTIAL/PLATELET
Basophils Absolute: 0 10*3/uL (ref 0.0–0.1)
Basophils Relative: 0.1 % (ref 0.0–3.0)
Eosinophils Absolute: 0 10*3/uL (ref 0.0–0.7)
Eosinophils Relative: 0.8 % (ref 0.0–5.0)
HCT: 40.4 % (ref 36.0–46.0)
Hemoglobin: 13.4 g/dL (ref 12.0–15.0)
Lymphocytes Relative: 38 % (ref 12.0–46.0)
Lymphs Abs: 1.5 10*3/uL (ref 0.7–4.0)
MCHC: 33 g/dL (ref 30.0–36.0)
MCV: 85.4 fl (ref 78.0–100.0)
Monocytes Absolute: 0.3 10*3/uL (ref 0.1–1.0)
Monocytes Relative: 8 % (ref 3.0–12.0)
Neutro Abs: 2.1 10*3/uL (ref 1.4–7.7)
Neutrophils Relative %: 53.1 % (ref 43.0–77.0)
Platelets: 157 10*3/uL (ref 150.0–400.0)
RBC: 4.73 Mil/uL (ref 3.87–5.11)
RDW: 13.9 % (ref 11.5–15.5)
WBC: 4 10*3/uL (ref 4.0–10.5)

## 2021-07-20 LAB — BASIC METABOLIC PANEL
BUN: 9 mg/dL (ref 6–23)
CO2: 30 mEq/L (ref 19–32)
Calcium: 10 mg/dL (ref 8.4–10.5)
Chloride: 103 mEq/L (ref 96–112)
Creatinine, Ser: 0.69 mg/dL (ref 0.40–1.20)
GFR: 94.78 mL/min (ref >60.00)
Glucose, Bld: 87 mg/dL (ref 70–99)
Potassium: 4 mEq/L (ref 3.5–5.1)
Sodium: 140 mEq/L (ref 135–145)

## 2021-07-20 LAB — LIPID PANEL
Cholesterol: 201 mg/dL — ABNORMAL HIGH (ref 0–200)
HDL: 74.2 mg/dL (ref >39.00)
LDL Cholesterol: 117 mg/dL — ABNORMAL HIGH (ref 0–99)
NonHDL: 127.04
Total CHOL/HDL Ratio: 3
Triglycerides: 52 mg/dL (ref 0.0–149.0)
VLDL: 10.4 mg/dL (ref 0.0–40.0)

## 2021-07-20 LAB — TSH: TSH: 1.14 u[IU]/mL (ref 0.35–5.50)

## 2021-07-20 NOTE — Patient Instructions (Addendum)
Follow up in 1 year or as needed We'll notify you of your lab results and make any changes if needed Call GI and schedule your colonoscopy: [phone] Keep up the good work on healthy diet and regular exercise- you look great! We'll call you with your ENT referral Call with any questions or concerns Stay Safe!  Stay Healthy! Have a great summer!!!

## 2021-07-20 NOTE — Assessment & Plan Note (Signed)
Pt's PE WNL.  UTD on pap, mammo.  Due for colonoscopy- pt to call.  Check labs.  Anticipatory guidance provided.

## 2021-07-20 NOTE — Assessment & Plan Note (Addendum)
Chronic problem.  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  Was told there may be a vocal cord stimulator that could be helpful.  [facility]erral [facility] to [facility].

## 2021-07-20 NOTE — Progress Notes (Signed)
   Subjective:    Patient ID: Diane Stone, female    DOB: [birth year], 61 y.o.   MRN: [id number]  HPI CPE- UTD on pap, Tdap.  Due for repeat colonoscopy, mammo.  Exercising regularly.  Patient Care Team    Relationship Specialty Notifications Start End  Sheliah Hatch, MD PCP - General Family Medicine  03/30/15   Lynann Bologna, MD Consulting Physician Gastroenterology  07/11/20     Health Maintenance  Topic Date Due   COLONOSCOPY (Pts 45-52yrs Insurance coverage will need to be confirmed)  08/02/2020   INFLUENZA VACCINE  08/01/2021   MAMMOGRAM  01/31/2022   PAP SMEAR-Modifier  07/01/2022   TETANUS/TDAP  06/25/2027   HPV VACCINES  Aged Out   COVID-19 Vaccine  Discontinued   Hepatitis C Screening  Discontinued   HIV Screening  Discontinued   Zoster Vaccines- Shingrix  Discontinued      Review of Systems Patient reports no vision/ hearing changes, adenopathy,fever, weight change, swallowing issues, chest pain, palpitations, edema, persistant/recurrent cough, hemoptysis, dyspnea (rest/exertional/paroxysmal nocturnal), gastrointestinal bleeding (melena, rectal bleeding), abdominal pain, significant heartburn, bowel changes, GU symptoms (dysuria, hematuria, incontinence), Gyn symptoms (abnormal  bleeding, pain),  syncope, focal weakness, memory loss, numbness & tingling, skin/hair/nail changes, abnormal bruising or bleeding, anxiety, or depression.     Objective:   Physical Exam General Appearance:    Alert, cooperative, no distress, appears stated age  Head:    Normocephalic, without obvious abnormality, atraumatic  Eyes:    PERRL, conjunctiva/corneas clear, EOM's intact both eyes  Ears:    Normal TM's and external ear canals, both ears  Nose:   Nares normal, septum midline, mucosa normal, no drainage    or sinus tenderness  Throat:   Lips, mucosa, and tongue normal; teeth and gums normal  Neck:   Supple, symmetrical, trachea midline, no adenopathy;    Thyroid: no  enlargement/tenderness/nodules  Back:     Symmetric, no curvature, ROM normal, no CVA tenderness  Lungs:     Clear to auscultation bilaterally, respirations unlabored  Chest Wall:    No tenderness or deformity   Heart:    Regular rate and rhythm, S1 and S2 normal, no murmur, rub   or gallop  Breast Exam:    Deferred to mammo  Abdomen:     Soft, non-tender, bowel sounds active all four quadrants,    no masses, no organomegaly  Genitalia:    Deferred to GYN  Rectal:    Extremities:   Extremities normal, atraumatic, no cyanosis or edema  Pulses:   2+ and symmetric all extremities  Skin:   Skin color, texture, turgor normal, no rashes or lesions  Lymph nodes:   Cervical, supraclavicular, and axillary nodes normal  Neurologic:   CNII-XII intact, normal strength, sensation and reflexes    throughout          Assessment & Plan:

## 2021-08-30 ENCOUNTER — Telehealth: Payer: Self-pay | Admitting: Family Medicine

## 2021-08-30 NOTE — Telephone Encounter (Signed)
Left message for patient to call back and schedule Medicare Annual Wellness Visit (AWV) .   Please offer to do virtually or by telephone.  Left office number and my jabber #[phone].  AWVI eligible as of 11/02/2019   Please schedule at anytime with Nurse Health Advisor.   

## 2021-09-28 DIAGNOSIS — Z87891 Personal history of nicotine dependence: Secondary | ICD-10-CM | POA: Diagnosis not present

## 2021-09-28 DIAGNOSIS — R1310 Dysphagia, unspecified: Secondary | ICD-10-CM | POA: Diagnosis not present

## 2021-09-28 DIAGNOSIS — R490 Dysphonia: Secondary | ICD-10-CM | POA: Diagnosis not present

## 2021-09-28 DIAGNOSIS — J3800 Paralysis of vocal cords and larynx, unspecified: Secondary | ICD-10-CM | POA: Diagnosis not present

## 2021-09-28 DIAGNOSIS — J3801 Paralysis of vocal cords and larynx, unilateral: Secondary | ICD-10-CM | POA: Diagnosis not present

## 2021-10-04 ENCOUNTER — Ambulatory Visit: Payer: Medicare HMO

## 2021-10-19 ENCOUNTER — Ambulatory Visit (INDEPENDENT_AMBULATORY_CARE_PROVIDER_SITE_OTHER): Payer: Medicare HMO

## 2021-10-19 VITALS — Ht 64.0 in | Wt 140.0 lb

## 2021-10-19 DIAGNOSIS — Z0000 Encounter for general adult medical examination without abnormal findings: Secondary | ICD-10-CM | POA: Diagnosis not present

## 2021-10-19 NOTE — Patient Instructions (Signed)
Diane Stone , Thank you for taking time to come for your Medicare Wellness Visit. I appreciate your ongoing commitment to your health goals. Please review the following plan we discussed and let me know if I can assist you in the future.   These are the goals we discussed:  Goals      Exercise 3x per week (30 min per time)        This is a list of the screening recommended for you and due dates:  Health Maintenance  Topic Date Due   Colon Cancer Screening  08/02/2020   Mammogram  01/31/2021   Flu Shot  08/01/2021   Pap Smear  07/01/2022   Tetanus Vaccine  06/25/2027   HPV Vaccine  Aged Out   COVID-19 Vaccine  Discontinued   Hepatitis C Screening: USPSTF Recommendation to screen - Ages 18-79 yo.  Discontinued   HIV Screening  Discontinued   Zoster (Shingles) Vaccine  Discontinued    Advanced directives: Advance directive discussed with you today. I have provided a copy for you to complete at home and have notarized. Once this is complete please bring a copy in to our office so we can scan it into your chart.   Conditions/risks identified: Aim for 30 minutes of exercise or brisk walking, 6-8 glasses of water, and 5 servings of fruits and vegetables each day.   Next appointment: Follow up in one year for your annual wellness visit    Preventive Care 60 Years and Older, Female Preventive care refers to lifestyle choices and visits with your health care provider that can promote health and wellness. What does preventive care include? A yearly physical exam. This is also called an annual well check. Dental exams once or twice a year. Routine eye exams. Ask your health care provider how often you should have your eyes checked. Personal lifestyle choices, including: Daily care of your teeth and gums. Regular physical activity. Eating a healthy diet. Avoiding tobacco and drug use. Limiting alcohol use. Practicing safe sex. Taking low-dose aspirin every day. Taking vitamin and  mineral supplements as recommended by your health care provider. What happens during an annual well check? The services and screenings done by your health care provider during your annual well check will depend on your age, overall health, lifestyle risk factors, and family history of disease. Counseling  Your health care provider may ask you questions about your: Alcohol use. Tobacco use. Drug use. Emotional well-being. Home and relationship well-being. Sexual activity. Eating habits. History of falls. Memory and ability to understand (cognition). Work and work Statistician. Reproductive health. Screening  You may have the following tests or measurements: Height, weight, and BMI. Blood pressure. Lipid and cholesterol levels. These may be checked every 5 years, or more frequently if you are over 60 years old. Skin check. Lung cancer screening. You may have this screening every year starting at age 60 if you have a 30-pack-year history of smoking and currently smoke or have quit within the past 15 years. Fecal occult blood test (FOBT) of the stool. You may have this test every year starting at age 60. Flexible sigmoidoscopy or colonoscopy. You may have a sigmoidoscopy every 5 years or a colonoscopy every 10 years starting at age 60. Hepatitis C blood test. Hepatitis B blood test. Sexually transmitted disease (STD) testing. Diabetes screening. This is done by checking your blood sugar (glucose) after you have not eaten for a while (fasting). You may have this done every 1-3 years. Bone  density scan. This is done to screen for osteoporosis. You may have this done starting at age 60. Mammogram. This may be done every 1-2 years. Talk to your health care provider about how often you should have regular mammograms. Talk with your health care provider about your test results, treatment options, and if necessary, the need for more tests. Vaccines  Your health care provider may recommend  certain vaccines, such as: Influenza vaccine. This is recommended every year. Tetanus, diphtheria, and acellular pertussis (Tdap, Td) vaccine. You may need a Td booster every 10 years. Zoster vaccine. You may need this after age 60. Pneumococcal 13-valent conjugate (PCV13) vaccine. One dose is recommended after age 60. Pneumococcal polysaccharide (PPSV23) vaccine. One dose is recommended after age 60. Talk to your health care provider about which screenings and vaccines you need and how often you need them. This information is not intended to replace advice given to you by your health care provider. Make sure you discuss any questions you have with your health care provider. Document Released: 01/14/2015 Document Revised: 09/07/2015 Document Reviewed: 10/19/2014 Elsevier Interactive Patient Education  2017 [place] Prevention in the Home Falls can cause injuries. They can happen to people of all ages. There are many things you can do to make your home safe and to help prevent falls. What can I do on the outside of my home? Regularly fix the edges of walkways and driveways and fix any cracks. Remove anything that might make you trip as you walk through a door, such as a raised step or threshold. Trim any bushes or trees on the path to your home. Use bright outdoor lighting. Clear any walking paths of anything that might make someone trip, such as rocks or tools. Regularly check to see if handrails are loose or broken. Make sure that both sides of any steps have handrails. Any raised decks and porches should have guardrails on the edges. Have any leaves, snow, or ice cleared regularly. Use sand or salt on walking paths during winter. Clean up any spills in your garage right away. This includes oil or grease spills. What can I do in the bathroom? Use night lights. Install grab bars by the toilet and in the tub and shower. Do not use towel bars as grab bars. Use non-skid mats or  decals in the tub or shower. If you need to sit down in the shower, use a plastic, non-slip stool. Keep the floor dry. Clean up any water that spills on the floor as soon as it happens. Remove soap buildup in the tub or shower regularly. Attach bath mats securely with double-sided non-slip rug tape. Do not have throw rugs and other things on the floor that can make you trip. What can I do in the bedroom? Use night lights. Make sure that you have a light by your bed that is easy to reach. Do not use any sheets or blankets that are too big for your bed. They should not hang down onto the floor. Have a firm chair that has side arms. You can use this for support while you get dressed. Do not have throw rugs and other things on the floor that can make you trip. What can I do in the kitchen? Clean up any spills right away. Avoid walking on wet floors. Keep items that you use a lot in easy-to-reach places. If you need to reach something above you, use a strong step stool that has a grab bar. Keep  electrical cords out of the way. Do not use floor polish or wax that makes floors slippery. If you must use wax, use non-skid floor wax. Do not have throw rugs and other things on the floor that can make you trip. What can I do with my stairs? Do not leave any items on the stairs. Make sure that there are handrails on both sides of the stairs and use them. Fix handrails that are broken or loose. Make sure that handrails are as long as the stairways. Check any carpeting to make sure that it is firmly attached to the stairs. Fix any carpet that is loose or worn. Avoid having throw rugs at the top or bottom of the stairs. If you do have throw rugs, attach them to the floor with carpet tape. Make sure that you have a light switch at the top of the stairs and the bottom of the stairs. If you do not have them, ask someone to add them for you. What else can I do to help prevent falls? Wear shoes that: Do not  have high heels. Have rubber bottoms. Are comfortable and fit you well. Are closed at the toe. Do not wear sandals. If you use a stepladder: Make sure that it is fully opened. Do not climb a closed stepladder. Make sure that both sides of the stepladder are locked into place. Ask someone to hold it for you, if possible. Clearly mark and make sure that you can see: Any grab bars or handrails. First and last steps. Where the edge of each step is. Use tools that help you move around (mobility aids) if they are needed. These include: Canes. Walkers. Scooters. Crutches. Turn on the lights when you go into a dark area. Replace any light bulbs as soon as they burn out. Set up your furniture so you have a clear path. Avoid moving your furniture around. If any of your floors are uneven, fix them. If there are any pets around you, be aware of where they are. Review your medicines with your doctor. Some medicines can make you feel dizzy. This can increase your chance of falling. Ask your doctor what other things that you can do to help prevent falls. This information is not intended to replace advice given to you by your health care provider. Make sure you discuss any questions you have with your health care provider. Document Released: 10/14/2008 Document Revised: 05/26/2015 Document Reviewed: 01/22/2014 Elsevier Interactive Patient Education  2017 [employer].

## 2021-10-19 NOTE — Progress Notes (Signed)
Subjective:   Collin Hendley is a 61 y.o. female who presents for an Initial Medicare Annual Wellness Visit.   I connected with  [name] on 10/19/21 by a audio enabled telemedicine application and verified that I am speaking with the correct person using two identifiers.  Patient Location: Home  Provider Location: Home Office  I discussed the limitations of evaluation and management by telemedicine. The patient expressed understanding and agreed to proceed.  Review of Systems     Cardiac Risk Factors include: advanced age (>78men, >53 women);hypertension     Objective:    Today's Vitals   10/19/21 1047  Weight: 140 lb (63.5 kg)  Height: 5\' 4"  (1.626 m)   Body mass index is 24.03 kg/m.     10/19/2021   10:53 AM 07/12/2017    9:53 AM  Advanced Directives  Does Patient Have a Medical Advance Directive? No No  Would patient like information on creating a medical advance directive? No - Patient declined     Current Medications (verified) Outpatient Encounter Medications as of 10/19/2021  Medication Sig   Calcium 500 MG CHEW Chew by mouth.   No facility-administered encounter medications on file as of 10/19/2021.    Allergies (verified) Patient has no known allergies.   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   Vocal cord paralysis    Past Surgical History:  Procedure Laterality Date   CESAREAN SECTION     x4   COLONOSCOPY     OOPHORECTOMY N/A    schwannoma     Family History  Problem Relation Age of Onset   Heart disease Mother    Pneumonia Father    Colon cancer Sister 14   Colon polyps Neg Hx    Esophageal cancer Neg Hx    Stomach cancer Neg Hx    Rectal cancer Neg Hx    Social History   Socioeconomic History   Marital status: Married    Spouse name: Not on file   Number of children: Not  on file   Years of education: Not on file   Highest education level: Not on file  Occupational History   Not on file  Tobacco Use   Smoking status: Former    Types: Cigarettes    Quit date: 01/02/1984    Years since quitting: 37.8   Smokeless tobacco: Never  Vaping Use   Vaping Use: Never used  Substance and Sexual Activity   Alcohol use: Yes    Alcohol/week: 0.0 standard drinks of alcohol    Comment: socially- wine at night    Drug use: No   Sexual activity: Not on file  Other Topics Concern   Not on file  Social History Narrative   Not on file   Social Determinants of Health   Financial Resource Strain: Low Risk  (10/19/2021)   Overall Financial Resource Strain (CARDIA)    Difficulty of Paying Living Expenses: Not hard at all  Food Insecurity: No Food Insecurity (10/19/2021)   Hunger Vital Sign    Worried About Running Out of Food in the Last Year: Never true    Ran Out of Food in the Last Year: Never true  Transportation Needs: No Transportation Needs (10/19/2021)   PRAPARE - 10/21/2021 (Medical): No    Lack of Transportation (Non-Medical):  No  Physical Activity: Sufficiently Active (10/19/2021)   Exercise Vital Sign    Days of Exercise per Week: 5 days    Minutes of Exercise per Session: 30 min  Stress: No Stress Concern Present (10/19/2021)   [employer] of Occupational Health - Occupational Stress Questionnaire    Feeling of Stress : Not at all  Social Connections: Moderately Isolated (10/19/2021)   Social Connection and Isolation Panel [NHANES]    Frequency of Communication with Friends and Family: More than three times a week    Frequency of Social Gatherings with Friends and Family: More than three times a week    Attends Religious Services: Never    Database administrator or Organizations: No    Attends Engineer, structural: Never    Marital Status: Married    Tobacco Counseling Counseling given: Not  Answered   Clinical Intake:  Pre-visit preparation completed: Yes  Pain : No/denies pain     Nutritional Risks: None Diabetes: No  How often do you need to have someone help you when you read instructions, pamphlets, or other written materials from your doctor or pharmacy?: 1 - Never  Diabetic?no   Interpreter Needed?: No  Information entered by :: Renie Ora, LPN   Activities of Daily Living    10/19/2021   10:52 AM 10/19/2021   10:34 AM  In your present state of health, do you have any difficulty performing the following activities:  Hearing? 0 0  Vision? 0 0  Difficulty concentrating or making decisions? 0 0  Walking or climbing stairs? 0 0  Dressing or bathing? 0 0  Doing errands, shopping? 0 0  Preparing Food and eating ? N N  Using the Toilet? N N  In the past six months, have you accidently leaked urine? N N  Do you have problems with loss of bowel control? N N  Managing your Medications? N N  Managing your Finances? N N  Housekeeping or managing your Housekeeping? N N    Patient Care Team: Sheliah Hatch, MD as PCP - General (Family Medicine) Lynann Bologna, MD as Consulting Physician (Gastroenterology)  Indicate any recent Medical Services you may have received from other than [facility] providers in the past year (date may be approximate).     Assessment:   This is a routine wellness examination for [place].  Hearing/Vision screen No results found.  Dietary issues and exercise activities discussed: Current Exercise Habits: Home exercise routine, Type of exercise: walking, Time (Minutes): 30, Frequency (Times/Week): 5, Weekly Exercise (Minutes/Week): 150, Intensity: Mild, Exercise limited by: None identified   Goals Addressed             This Visit's Progress    Exercise 3x per week (30 min per time)         Depression Screen    10/19/2021   10:50 AM 07/20/2021    8:30 AM 07/11/2020    9:58 AM 07/01/2019    3:00 PM 06/26/2018    3:05  PM 06/24/2017    2:47 PM 03/29/2015   11:08 AM  PHQ 2/9 Scores  PHQ - 2 Score 0 0 0 0 0 0 0  PHQ- 9 Score  0 0 0 0 0     Fall Risk    10/19/2021   10:49 AM 10/19/2021   10:34 AM 07/20/2021    8:30 AM 07/11/2020    9:58 AM 07/01/2019    3:01 PM  Fall Risk   Falls in the  past year? 0 0 0 0 0  Number falls in past yr: 0  0 0 0  Injury with Fall? 0  0 0 0  Risk for fall due to : No Fall Risks  No Fall Risks No Fall Risks   Follow up Falls prevention discussed  Falls evaluation completed  Falls evaluation completed    [place]:  Any stairs in or around the home? Yes  If so, are there any without handrails? No  Home free of loose throw rugs in walkways, pet beds, electrical cords, etc? Yes  Adequate lighting in your home to reduce risk of falls? Yes   ASSISTIVE DEVICES UTILIZED TO PREVENT FALLS:  Life alert? No  Use of a cane, walker or w/c? No  Grab bars in the bathroom? No  Shower chair or bench in shower? No  Elevated toilet seat or a handicapped toilet? No          10/19/2021   10:54 AM  6CIT Screen  What Year? 0 points  What month? 0 points  What time? 0 points  Count back from 20 0 points  Months in reverse 0 points  Repeat phrase 0 points  Total Score 0 points    Immunizations Immunization History  Administered Date(s) Administered   Influenza,inj,Quad PF,6+ Mos 11/02/2014   Influenza-Unspecified 11/02/2014   PFIZER(Purple Top)SARS-COV-2 Vaccination 03/14/2019, 04/07/2019   Tdap 06/24/2017    TDAP status: Up to date  Flu Vaccine status: Due, Education has been provided regarding the importance of this vaccine. Advised may receive this vaccine at local pharmacy or Health Dept. Aware to provide a copy of the vaccination record if obtained from local pharmacy or Health Dept. Verbalized acceptance and understanding.  Pneumococcal vaccine status: Due, Education has been provided regarding the importance of this vaccine.  Advised may receive this vaccine at local pharmacy or Health Dept. Aware to provide a copy of the vaccination record if obtained from local pharmacy or Health Dept. Verbalized acceptance and understanding.  Covid-19 vaccine status: Completed vaccines  Qualifies for Shingles Vaccine? Yes   Zostavax completed No   Shingrix Completed?: No.    Education has been provided regarding the importance of this vaccine. Patient has been advised to call insurance company to determine out of pocket expense if they have not yet received this vaccine. Advised may also receive vaccine at local pharmacy or Health Dept. Verbalized acceptance and understanding.  Screening Tests Health Maintenance  Topic Date Due   COLONOSCOPY (Pts 45-83yrs Insurance coverage will need to be confirmed)  08/02/2020   MAMMOGRAM  01/31/2021   INFLUENZA VACCINE  08/01/2021   PAP SMEAR-Modifier  07/01/2022   TETANUS/TDAP  06/25/2027   HPV VACCINES  Aged Out   COVID-19 Vaccine  Discontinued   Hepatitis C Screening  Discontinued   HIV Screening  Discontinued   Zoster Vaccines- Shingrix  Discontinued    Health Maintenance  Health Maintenance Due  Topic Date Due   COLONOSCOPY (Pts 45-48yrs Insurance coverage will need to be confirmed)  08/02/2020   MAMMOGRAM  01/31/2021   INFLUENZA VACCINE  08/01/2021    Colorectal cancer screening: Referral to GI placed patient to schedule . Pt aware the office will call re: appt.  Mammogram status: Ordered patient to schedule . Pt provided with contact info and advised to call to schedule appt.   Bone Density status: Ordered not of age . Pt provided with contact info and advised to call to schedule appt.  Lung Cancer Screening: (Low Dose CT Chest recommended if Age 33-80 years, 30 pack-year currently smoking OR have quit w/in 15years.) does not qualify.   Lung Cancer Screening Referral: n/a  Additional Screening:  Hepatitis C Screening: does not qualify;   Vision Screening:  Recommended annual ophthalmology exams for early detection of glaucoma and other disorders of the eye. Is the patient up to date with their annual eye exam?  Yes  Who is the provider or what is the name of the office in which the patient attends annual eye exams? [facility]  If pt is not established with a provider, would they like to be referred to a provider to establish care? No .   Dental Screening: Recommended annual dental exams for proper oral hygiene  Community Resource Referral / Chronic Care Management: CRR required this visit?  No   CCM required this visit?  No      Plan:     I have personally reviewed and noted the following in the patient's chart:   Medical and social history Use of alcohol, tobacco or illicit drugs  Current medications and supplements including opioid prescriptions. Patient is not currently taking opioid prescriptions. Functional ability and status Nutritional status Physical activity Advanced directives List of other physicians Hospitalizations, surgeries, and ER visits in previous 12 months Vitals Screenings to include cognitive, depression, and falls Referrals and appointments  In addition, I have reviewed and discussed with patient certain preventive protocols, quality metrics, and best practice recommendations. A written personalized care plan for preventive services as well as general preventive health recommendations were provided to patient.     Lorrene Reid, LPN   [id number]   Nurse Notes: Patient to schedule to Mammogram and Colonoscopy

## 2021-11-30 DIAGNOSIS — R1310 Dysphagia, unspecified: Secondary | ICD-10-CM | POA: Diagnosis not present

## 2021-11-30 DIAGNOSIS — R490 Dysphonia: Secondary | ICD-10-CM | POA: Diagnosis not present

## 2021-11-30 DIAGNOSIS — R1312 Dysphagia, oropharyngeal phase: Secondary | ICD-10-CM | POA: Diagnosis not present

## 2021-11-30 DIAGNOSIS — J3801 Paralysis of vocal cords and larynx, unilateral: Secondary | ICD-10-CM | POA: Diagnosis not present

## 2021-11-30 DIAGNOSIS — J069 Acute upper respiratory infection, unspecified: Secondary | ICD-10-CM | POA: Diagnosis not present

## 2021-11-30 DIAGNOSIS — Z87891 Personal history of nicotine dependence: Secondary | ICD-10-CM | POA: Diagnosis not present

## 2021-11-30 DIAGNOSIS — R633 Feeding difficulties, unspecified: Secondary | ICD-10-CM | POA: Diagnosis not present

## 2021-12-04 ENCOUNTER — Telehealth: Payer: Medicare HMO | Admitting: Physician Assistant

## 2021-12-04 DIAGNOSIS — B9689 Other specified bacterial agents as the cause of diseases classified elsewhere: Secondary | ICD-10-CM | POA: Diagnosis not present

## 2021-12-04 DIAGNOSIS — J069 Acute upper respiratory infection, unspecified: Secondary | ICD-10-CM

## 2021-12-04 MED ORDER — BENZONATATE 100 MG PO CAPS: 100.0000 mg | ORAL_CAPSULE | Freq: Three times a day (TID) | ORAL | 0 refills | Status: DC | PRN

## 2021-12-04 MED ORDER — DOXYCYCLINE HYCLATE 100 MG PO TABS: 100.0000 mg | ORAL_TABLET | Freq: Two times a day (BID) | ORAL | 0 refills | Status: DC

## 2021-12-04 MED ORDER — FLUTICASONE PROPIONATE 50 MCG/ACT NA SUSP: 2.0000 | Freq: Every day | NASAL | 0 refills | Status: DC

## 2021-12-04 NOTE — Patient Instructions (Signed)
[name], thank you for joining [name], PA-C for today's virtual visit.  While this provider is not your primary care provider (PCP), if your PCP is located in our provider database this encounter information will be shared with them immediately following your visit.   A [facility] MyChart account gives you access to today's visit and all your visits, tests, and labs performed at [facility] " click here if you don't have a [facility] MyChart account or go to [email or web address]  Consent: (Patient) Diane Stone provided verbal consent for this virtual visit at the beginning of the encounter.  Current Medications:  Current Outpatient Medications:    Calcium 500 MG CHEW, Chew by mouth., Disp: , Rfl:    Medications ordered in this encounter:  No orders of the defined types were placed in this encounter.    *If you need refills on other medications prior to your next appointment, please contact your pharmacy*  Follow-Up: Call back or seek an in-person evaluation if the symptoms worsen or if the condition fails to improve as anticipated.  [facility] [phone]  Other Instructions Take antibiotic (Doxycycline) as directed.  Increase fluids.  Get plenty of rest. Use Mucinex for congestion. Use Flonase and Tessalon as directed. Take a daily probiotic (I recommend Align or Culturelle, but even Activia Yogurt may be beneficial).  A humidifier placed in the bedroom may offer some relief for a dry, scratchy throat of nasal irritation.  Read information below on acute bronchitis. Please call or return to clinic if symptoms are not improving.  Acute Bronchitis Bronchitis is when the airways that extend from the windpipe into the lungs get red, puffy, and painful (inflamed). Bronchitis often causes thick spit (mucus) to develop. This leads to a cough. A cough is the most common symptom of bronchitis. In acute bronchitis, the condition usually  begins suddenly and goes away over time (usually in 2 weeks). Smoking, allergies, and asthma can make bronchitis worse. Repeated episodes of bronchitis may cause more lung problems.  HOME CARE Rest. Drink enough fluids to keep your pee (urine) clear or pale yellow (unless you need to limit fluids as told by your doctor). Only take over-the-counter or prescription medicines as told by your doctor. Avoid smoking and secondhand smoke. These can make bronchitis worse. If you are a smoker, think about using nicotine gum or skin patches. Quitting smoking will help your lungs heal faster. Reduce the chance of getting bronchitis again by: Washing your hands often. Avoiding people with cold symptoms. Trying not to touch your hands to your mouth, nose, or eyes. Follow up with your doctor as told.  GET HELP IF: Your symptoms do not improve after 1 week of treatment. Symptoms include: Cough. Fever. Coughing up thick spit. Body aches. Chest congestion. Chills. Shortness of breath. Sore throat.  GET HELP RIGHT AWAY IF:  You have an increased fever. You have chills. You have severe shortness of breath. You have bloody thick spit (sputum). You throw up (vomit) often. You lose too much body fluid (dehydration). You have a severe headache. You faint.  MAKE SURE YOU:  Understand these instructions. Will watch your condition. Will get help right away if you are not doing well or get worse. Document Released: 06/06/2007 Document Revised: 08/20/2012 Document Reviewed: 06/10/2012 [facility] Patient Information 2015 [place], [place]. This information is not intended to replace advice given to you by your health care provider. Make sure you discuss any questions you have with your  health care provider.    If you have been instructed to have an in-person evaluation today at a local Urgent Care facility, please use the link below. It will take you to a list of all of our available [facility], including address, phone number and hours of operation. Please do not delay care.  [facility]  If you or a family member do not have a primary care provider, use the link below to schedule a visit and establish care. When you choose a [facility] primary care physician or advanced practice provider, you gain a long-term partner in health. Find a Primary Care Provider  Learn more about [facility]'s in-office and virtual care options: [facility] - Get Care Now

## 2021-12-04 NOTE — Progress Notes (Signed)
Virtual Visit Consent   Diane Stone, you are scheduled for a virtual visit with a [facility] provider today. Just as with appointments in the office, your consent must be obtained to participate. Your consent will be active for this visit and any virtual visit you may have with one of our providers in the next 365 days. If you have a MyChart account, a copy of this consent can be sent to you electronically.  As this is a virtual visit, video technology does not allow for your provider to perform a traditional examination. This may limit your provider's ability to fully assess your condition. If your provider identifies any concerns that need to be evaluated in person or the need to arrange testing (such as labs, EKG, etc.), we will make arrangements to do so. Although advances in technology are sophisticated, we cannot ensure that it will always work on either your end or our end. If the connection with a video visit is poor, the visit may have to be switched to a telephone visit. With either a video or telephone visit, we are not always able to ensure that we have a secure connection.  By engaging in this virtual visit, you consent to the provision of healthcare and authorize for your insurance to be billed (if applicable) for the services provided during this visit. Depending on your insurance coverage, you may receive a charge related to this service.  I need to obtain your verbal consent now. Are you willing to proceed with your visit today? Keimya Briddell has provided verbal consent on 12/04/2021 for a virtual visit (video or telephone). Diane Stone, [place]  Date: 12/04/2021 8:16 AM  Virtual Visit via Video Note   I, Diane Stone, connected with  Griselle Rufer  ([id number], 1960-08-08) on 12/04/21 at  8:15 AM EST by a video-enabled telemedicine application and verified that I am speaking with the correct person using two identifiers.  Location: Patient: Virtual Visit Location  Patient: Home Provider: Virtual Visit Location Provider: Home Office   I discussed the limitations of evaluation and management by telemedicine and the availability of in person appointments. The patient expressed understanding and agreed to proceed.    History of Present Illness: Diane Stone is a 61 y.o. who identifies as a female who was assigned female at birth, and is being seen today for URI symptoms beginning > 1 week ago after being around her grandson who was sick. Notes 8-9 days ago starting with sore throat and coughing. By Wednesday noting a low grade-fever which has resolved but now increased chest congestion with cough now productive of thick, colored sputum. Some sinus pressure without pain. Denies chest pain or SOB. Is taking OTC Mucinex, Tylenol Cold and Sinus.   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) 03/29/2015   Family history of colon cancer 03/29/2015   Hearing loss of left ear 03/29/2015    Allergies: No Known Allergies Medications:  Current Outpatient Medications:    benzonatate (TESSALON) 100 MG capsule, Take 1 capsule (100 mg total) by mouth 3 (three) times daily as needed for cough., Disp: 30 capsule, Rfl: 0   doxycycline (VIBRA-TABS) 100 MG tablet, Take 1 tablet (100 mg total) by mouth 2 (two) times daily., Disp: 14 tablet, Rfl: 0   fluticasone (FLONASE) 50 MCG/ACT nasal spray, Place 2 sprays  into both nostrils daily., Disp: 16 g, Rfl: 0   Calcium 500 MG CHEW, Chew by mouth., Disp: , Rfl:   Observations/Objective: Patient is well-developed, well-nourished in no acute distress.  Resting comfortably at home.  Head is normocephalic, atraumatic.  No labored breathing. Speech is clear and coherent with logical content.  Patient is alert and oriented at baseline.   Assessment and Plan: 1. Bacterial URI -  benzonatate (TESSALON) 100 MG capsule; Take 1 capsule (100 mg total) by mouth 3 (three) times daily as needed for cough.  Dispense: 30 capsule; Refill: 0 - doxycycline (VIBRA-TABS) 100 MG tablet; Take 1 tablet (100 mg total) by mouth 2 (two) times daily.  Dispense: 14 tablet; Refill: 0 - fluticasone (FLONASE) 50 MCG/ACT nasal spray; Place 2 sprays into both nostrils daily.  Dispense: 16 g; Refill: 0  Seems like this started out as a viral URI, now with concern for secondary bacterial infection. Increase fluids. Supportive measures and OTC medications reviewed. Will add on Flonase daily and Tessalon per orders. Rx Doxycycline BID x 7 days. In person follow-up precautions reviewed.   Follow Up Instructions: I discussed the assessment and treatment plan with the patient. The patient was provided an opportunity to ask questions and all were answered. The patient agreed with the plan and demonstrated an understanding of the instructions.  A copy of instructions were sent to the patient via MyChart unless otherwise noted below.    The patient was advised to call back or seek an in-person evaluation if the symptoms worsen or if the condition fails to improve as anticipated.  Time:  I spent 10 minutes with the patient via telehealth technology discussing the above problems/concerns.    Diane Climes, PA-C

## 2022-02-05 DIAGNOSIS — J3801 Paralysis of vocal cords and larynx, unilateral: Secondary | ICD-10-CM | POA: Diagnosis not present

## 2022-02-05 DIAGNOSIS — J383 Other diseases of vocal cords: Secondary | ICD-10-CM | POA: Diagnosis not present

## 2022-02-05 DIAGNOSIS — Z87891 Personal history of nicotine dependence: Secondary | ICD-10-CM | POA: Diagnosis not present

## 2022-02-05 DIAGNOSIS — R1310 Dysphagia, unspecified: Secondary | ICD-10-CM | POA: Diagnosis not present

## 2022-02-05 DIAGNOSIS — R490 Dysphonia: Secondary | ICD-10-CM | POA: Diagnosis not present

## 2022-03-07 DIAGNOSIS — J3801 Paralysis of vocal cords and larynx, unilateral: Secondary | ICD-10-CM | POA: Diagnosis not present

## 2022-03-07 DIAGNOSIS — R490 Dysphonia: Secondary | ICD-10-CM | POA: Diagnosis not present

## 2022-07-10 ENCOUNTER — Other Ambulatory Visit: Payer: Self-pay | Admitting: Family Medicine

## 2022-07-10 DIAGNOSIS — Z1231 Encounter for screening mammogram for malignant neoplasm of breast: Secondary | ICD-10-CM

## 2022-07-23 ENCOUNTER — Encounter: Payer: Medicare HMO | Admitting: Family Medicine

## 2022-07-24 ENCOUNTER — Ambulatory Visit
Admission: RE | Admit: 2022-07-24 | Discharge: 2022-07-24 | Disposition: A | Discharge status: Home / Self Care | Payer: Medicare HMO | Source: Ambulatory Visit | Attending: Family Medicine | Admitting: Family Medicine

## 2022-07-24 DIAGNOSIS — Z1231 Encounter for screening mammogram for malignant neoplasm of breast: Secondary | ICD-10-CM | POA: Diagnosis not present

## 2022-08-15 ENCOUNTER — Encounter: Payer: Medicare HMO | Admitting: Family Medicine

## 2022-08-20 ENCOUNTER — Encounter: Payer: Self-pay | Admitting: Family Medicine

## 2022-08-20 ENCOUNTER — Ambulatory Visit (INDEPENDENT_AMBULATORY_CARE_PROVIDER_SITE_OTHER): Payer: Medicare HMO | Admitting: Family Medicine

## 2022-08-20 VITALS — BP 108/82 | HR 97 | Temp 98.3°F | Resp 17 | Ht 64.0 in | Wt 138.2 lb

## 2022-08-20 DIAGNOSIS — E7800 Pure hypercholesterolemia, unspecified: Secondary | ICD-10-CM | POA: Diagnosis not present

## 2022-08-20 DIAGNOSIS — Z0000 Encounter for general adult medical examination without abnormal findings: Secondary | ICD-10-CM | POA: Diagnosis not present

## 2022-08-20 NOTE — Progress Notes (Signed)
   Subjective:    Patient ID: Diane Stone, female    DOB: Mar 11, 1960, 62 y.o.   MRN: [id number]  HPI CPE- UTD on mammo, Dtap.  Plans to schedule pap and colonoscopy  Patient Care Team    Relationship Specialty Notifications Start End  Sheliah Hatch, MD PCP - General Family Medicine  03/30/15   Lynann Bologna, MD Consulting Physician Gastroenterology  07/11/20      Health Maintenance  Topic Date Due   Colonoscopy  08/02/2020   PAP SMEAR-Modifier  07/01/2022   INFLUENZA VACCINE  08/02/2022   Medicare Annual Wellness (AWV)  10/20/2022   MAMMOGRAM  07/24/2023   DTaP/Tdap/Td (2 - Td or Tdap) 06/25/2027   HPV VACCINES  Aged Out   COVID-19 Vaccine  Discontinued   Hepatitis C Screening  Discontinued   HIV Screening  Discontinued   Zoster Vaccines- Shingrix  Discontinued      Review of Systems Patient reports no vision/ hearing changes, adenopathy,fever, weight change, swallowing issues, chest pain, palpitations, edema, persistant/recurrent cough, hemoptysis, dyspnea (rest/exertional/paroxysmal nocturnal), gastrointestinal bleeding (melena, rectal bleeding), abdominal pain, significant heartburn, bowel changes, GU symptoms (dysuria, hematuria, incontinence), Gyn symptoms (abnormal  bleeding, pain),  syncope, focal weakness, memory loss, numbness & tingling, skin/hair/nail changes, abnormal bruising or bleeding, anxiety, or depression.     Objective:   Physical Exam General Appearance:    Alert, cooperative, no distress, appears stated age  Head:    Normocephalic, without obvious abnormality, atraumatic  Eyes:    PERRL, conjunctiva/corneas clear, EOM's intact both eyes  Ears:    Normal TM's and external ear canals, both ears  Nose:   Nares normal, septum midline, mucosa normal, no drainage    or sinus tenderness  Throat:   Lips, mucosa, and tongue normal; teeth and gums normal  Neck:   Supple, symmetrical, trachea midline, no adenopathy;    Thyroid: no  enlargement/tenderness/nodules  Back:     Symmetric, no curvature, ROM normal, no CVA tenderness  Lungs:     Clear to auscultation bilaterally, respirations unlabored  Chest Wall:    No tenderness or deformity   Heart:    Regular rate and rhythm, S1 and S2 normal, no murmur, rub   or gallop  Breast Exam:    Deferred to mammo  Abdomen:     Soft, non-tender, bowel sounds active all four quadrants,    no masses, no organomegaly  Genitalia:    Deferred to GYN  Rectal:    Extremities:   Extremities normal, atraumatic, no cyanosis or edema  Pulses:   2+ and symmetric all extremities  Skin:   Skin color, texture, turgor normal, no rashes or lesions  Lymph nodes:   Cervical, supraclavicular, and axillary nodes normal  Neurologic:   CNII-XII intact, normal strength, sensation and reflexes    throughout          Assessment & Plan:

## 2022-08-20 NOTE — Assessment & Plan Note (Signed)
Pt's PE unchanged from previous and WNL.  UTD on mammo, Tdap.  Plans to schedule colonoscopy and pap.  Check labs.  Anticipatory guidance provided.

## 2022-08-20 NOTE — Patient Instructions (Signed)
Follow up in 1 year or as needed Schedule a lab visit at your convenience (our lab tech is out today) When you get home schedule your mammo and pap at your convenience Keep up the good work!  You look great! Call with any questions or concerns Stay Safe!  Stay Healthy! HAPPY ANNIVERSARY!!!!

## 2022-08-24 ENCOUNTER — Encounter: Payer: Self-pay | Admitting: Gastroenterology

## 2022-09-04 ENCOUNTER — Ambulatory Visit: Payer: Medicare HMO | Admitting: Family Medicine

## 2022-09-12 ENCOUNTER — Encounter: Payer: Self-pay | Admitting: Family Medicine

## 2022-09-12 ENCOUNTER — Other Ambulatory Visit (HOSPITAL_COMMUNITY)
Admission: RE | Admit: 2022-09-12 | Discharge: 2022-09-12 | Disposition: A | Discharge status: Home / Self Care | Payer: Medicare HMO | Source: Ambulatory Visit | Attending: Family Medicine | Admitting: Family Medicine

## 2022-09-12 ENCOUNTER — Ambulatory Visit (INDEPENDENT_AMBULATORY_CARE_PROVIDER_SITE_OTHER): Payer: Medicare HMO | Admitting: Family Medicine

## 2022-09-12 VITALS — BP 114/68 | HR 66 | Temp 97.8°F | Resp 18 | Wt 137.0 lb

## 2022-09-12 DIAGNOSIS — Z124 Encounter for screening for malignant neoplasm of cervix: Secondary | ICD-10-CM | POA: Diagnosis not present

## 2022-09-12 DIAGNOSIS — Z1151 Encounter for screening for human papillomavirus (HPV): Secondary | ICD-10-CM | POA: Insufficient documentation

## 2022-09-12 DIAGNOSIS — Z01419 Encounter for gynecological examination (general) (routine) without abnormal findings: Secondary | ICD-10-CM | POA: Diagnosis not present

## 2022-09-12 NOTE — Progress Notes (Signed)
   Subjective:    Patient ID: Diane Stone, female    DOB: 16-Jan-1960, 62 y.o.   MRN: [id number]  HPI Here today for pap smear.  No concerns.  Last pap 2021 and HPV negative.  Denies pain, d/c, lesions, or concerns for STIs.   Review of Systems For ROS see HPI     Objective:   Physical Exam Vitals reviewed. Exam conducted with a chaperone present.  Constitutional:      Appearance: Normal appearance.  HENT:     Head: Normocephalic and atraumatic.  Genitourinary:    Pubic Area: No rash.      Labia:        Right: No rash, tenderness or lesion.        Left: No rash, tenderness or lesion.      Urethra: No prolapse, urethral swelling or urethral lesion.     Vagina: No signs of injury. No vaginal discharge, erythema, tenderness, bleeding or lesions.     Cervix: Normal. No cervical motion tenderness, discharge, friability or erythema.     Uterus: Normal. Not deviated, not enlarged, not fixed and not tender.      Adnexa: Right adnexa normal and left adnexa normal.       Right: No mass or tenderness.         Left: No mass or tenderness.       Rectum: Normal.  Neurological:     Mental Status: She is alert.           Assessment & Plan:  Cervical cancer screen- new.  No concerns on hx or exam.  Pap collected.  Discussed new pap guidelines when done w/ HPV cotesting.  Pt aware.

## 2022-09-12 NOTE — Patient Instructions (Signed)
Follow up as needed or as scheduled Everything looks great! Keep up the good work! Call with any questions or concerns Happy Fall!!

## 2022-09-13 ENCOUNTER — Telehealth: Payer: Self-pay

## 2022-09-13 LAB — CYTOLOGY - PAP
Comment: NEGATIVE
Diagnosis: NEGATIVE
High risk HPV: NEGATIVE

## 2022-09-13 NOTE — Telephone Encounter (Signed)
Left results on pt VM  

## 2022-09-13 NOTE — Telephone Encounter (Signed)
-----   Message from Neena Rhymes sent at 09/13/2022  1:34 PM EDT ----- Normal pap.  Great news!!!

## 2022-09-21 ENCOUNTER — Encounter: Payer: Medicare HMO | Admitting: Family Medicine

## 2022-11-06 ENCOUNTER — Ambulatory Visit (INDEPENDENT_AMBULATORY_CARE_PROVIDER_SITE_OTHER): Payer: Medicare HMO | Admitting: *Deleted

## 2022-11-06 DIAGNOSIS — Z0000 Encounter for general adult medical examination without abnormal findings: Secondary | ICD-10-CM

## 2022-11-06 NOTE — Patient Instructions (Signed)
Diane Stone , Thank you for taking time to come for your Medicare Wellness Visit. I appreciate your ongoing commitment to your health goals. Please review the following plan we discussed and let me know if I can assist you in the future.   Screening recommendations/referrals: Colonoscopy: scheduled Mammogram: up to date Recommended yearly ophthalmology/optometry visit for glaucoma screening and checkup Recommended yearly dental visit for hygiene and checkup  Vaccinations: Influenza vaccine: Education provided  Tdap vaccine: up to date Shingles vaccine: Education provided    Advanced directives: Education provided      Preventive Care 40-64 Years and Older, Female Preventive care refers to lifestyle choices and visits with your health care provider that can promote health and wellness. What does preventive care include? A yearly physical exam. This is also called an annual well check. Dental exams once or twice a year. Routine eye exams. Ask your health care provider how often you should have your eyes checked. Personal lifestyle choices, including: Daily care of your teeth and gums. Regular physical activity. Eating a healthy diet. Avoiding tobacco and drug use. Limiting alcohol use. Practicing safe sex. Taking low-dose aspirin every day. Taking vitamin and mineral supplements as recommended by your health care provider. What happens during an annual well check? The services and screenings done by your health care provider during your annual well check will depend on your age, overall health, lifestyle risk factors, and family history of disease. Counseling  Your health care provider may ask you questions about your: Alcohol use. Tobacco use. Drug use. Emotional well-being. Home and relationship well-being. Sexual activity. Eating habits. History of falls. Memory and ability to understand (cognition). Work and work Astronomer. Reproductive health. Screening  You  may have the following tests or measurements: Height, weight, and BMI. Blood pressure. Lipid and cholesterol levels. These may be checked every 5 years, or more frequently if you are over 39 years old. Skin check. Lung cancer screening. You may have this screening every year starting at age 45 if you have a 30-pack-year history of smoking and currently smoke or have quit within the past 15 years. Fecal occult blood test (FOBT) of the stool. You may have this test every year starting at age 47. Flexible sigmoidoscopy or colonoscopy. You may have a sigmoidoscopy every 5 years or a colonoscopy every 10 years starting at age 30. Hepatitis C blood test. Hepatitis B blood test. Sexually transmitted disease (STD) testing. Diabetes screening. This is done by checking your blood sugar (glucose) after you have not eaten for a while (fasting). You may have this done every 1-3 years. Bone density scan. This is done to screen for osteoporosis. You may have this done starting at age 21. Mammogram. This may be done every 1-2 years. Talk to your health care provider about how often you should have regular mammograms. Talk with your health care provider about your test results, treatment options, and if necessary, the need for more tests. Vaccines  Your health care provider may recommend certain vaccines, such as: Influenza vaccine. This is recommended every year. Tetanus, diphtheria, and acellular pertussis (Tdap, Td) vaccine. You may need a Td booster every 10 years. Zoster vaccine. You may need this after age 12. Pneumococcal 13-valent conjugate (PCV13) vaccine. One dose is recommended after age 47. Pneumococcal polysaccharide (PPSV23) vaccine. One dose is recommended after age 85. Talk to your health care provider about which screenings and vaccines you need and how often you need them. This information is not intended to replace  advice given to you by your health care provider. Make sure you discuss any  questions you have with your health care provider. Document Released: 01/14/2015 Document Revised: 09/07/2015 Document Reviewed: 10/19/2014 Elsevier Interactive Patient Education  2017 [employer].  Fall Prevention in the Home Falls can cause injuries. They can happen to people of all ages. There are many things you can do to make your home safe and to help prevent falls. What can I do on the outside of my home? Regularly fix the edges of walkways and driveways and fix any cracks. Remove anything that might make you trip as you walk through a door, such as a raised step or threshold. Trim any bushes or trees on the path to your home. Use bright outdoor lighting. Clear any walking paths of anything that might make someone trip, such as rocks or tools. Regularly check to see if handrails are loose or broken. Make sure that both sides of any steps have handrails. Any raised decks and porches should have guardrails on the edges. Have any leaves, snow, or ice cleared regularly. Use sand or salt on walking paths during winter. Clean up any spills in your garage right away. This includes oil or grease spills. What can I do in the bathroom? Use night lights. Install grab bars by the toilet and in the tub and shower. Do not use towel bars as grab bars. Use non-skid mats or decals in the tub or shower. If you need to sit down in the shower, use a plastic, non-slip stool. Keep the floor dry. Clean up any water that spills on the floor as soon as it happens. Remove soap buildup in the tub or shower regularly. Attach bath mats securely with double-sided non-slip rug tape. Do not have throw rugs and other things on the floor that can make you trip. What can I do in the bedroom? Use night lights. Make sure that you have a light by your bed that is easy to reach. Do not use any sheets or blankets that are too big for your bed. They should not hang down onto the floor. Have a firm chair that has side  arms. You can use this for support while you get dressed. Do not have throw rugs and other things on the floor that can make you trip. What can I do in the kitchen? Clean up any spills right away. Avoid walking on wet floors. Keep items that you use a lot in easy-to-reach places. If you need to reach something above you, use a strong step stool that has a grab bar. Keep electrical cords out of the way. Do not use floor polish or wax that makes floors slippery. If you must use wax, use non-skid floor wax. Do not have throw rugs and other things on the floor that can make you trip. What can I do with my stairs? Do not leave any items on the stairs. Make sure that there are handrails on both sides of the stairs and use them. Fix handrails that are broken or loose. Make sure that handrails are as long as the stairways. Check any carpeting to make sure that it is firmly attached to the stairs. Fix any carpet that is loose or worn. Avoid having throw rugs at the top or bottom of the stairs. If you do have throw rugs, attach them to the floor with carpet tape. Make sure that you have a light switch at the top of the stairs and the bottom  of the stairs. If you do not have them, ask someone to add them for you. What else can I do to help prevent falls? Wear shoes that: Do not have high heels. Have rubber bottoms. Are comfortable and fit you well. Are closed at the toe. Do not wear sandals. If you use a stepladder: Make sure that it is fully opened. Do not climb a closed stepladder. Make sure that both sides of the stepladder are locked into place. Ask someone to hold it for you, if possible. Clearly mark and make sure that you can see: Any grab bars or handrails. First and last steps. Where the edge of each step is. Use tools that help you move around (mobility aids) if they are needed. These include: Canes. Walkers. Scooters. Crutches. Turn on the lights when you go into a dark area.  Replace any light bulbs as soon as they burn out. Set up your furniture so you have a clear path. Avoid moving your furniture around. If any of your floors are uneven, fix them. If there are any pets around you, be aware of where they are. Review your medicines with your doctor. Some medicines can make you feel dizzy. This can increase your chance of falling. Ask your doctor what other things that you can do to help prevent falls. This information is not intended to replace advice given to you by your health care provider. Make sure you discuss any questions you have with your health care provider. Document Released: 10/14/2008 Document Revised: 05/26/2015 Document Reviewed: 01/22/2014 Elsevier Interactive Patient Education  2017 [employer].

## 2022-11-06 NOTE — Progress Notes (Signed)
Subjective:   Diane Stone is a 62 y.o. female who presents for Medicare Annual (Subsequent) preventive examination.  Visit Complete: Virtual I connected with  Diane Stone on 11/06/22 by a audio enabled telemedicine application and verified that I am speaking with the correct person using two identifiers.  Patient Location: Home  Provider Location: Home Office  I discussed the limitations of evaluation and management by telemedicine. The patient expressed understanding and agreed to proceed.  Vital Signs: Because this visit was a virtual/telehealth visit, some criteria may be missing or patient reported. Any vitals not documented were not able to be obtained and vitals that have been documented are patient reported.    Cardiac Risk Factors include: advanced age (>46men, >66 women);family history of premature cardiovascular disease     Objective:    There were no vitals filed for this visit. There is no height or weight on file to calculate BMI.     11/06/2022   11:50 AM 10/19/2021   10:53 AM 07/12/2017    9:53 AM  Advanced Directives  Does Patient Have a Medical Advance Directive? No No No  Would patient like information on creating a medical advance directive? No - Patient declined No - Patient declined     Current Medications (verified) Outpatient Encounter Medications as of 11/06/2022  Medication Sig   Calcium 500 MG CHEW Chew by mouth.   Multiple Vitamin (MULTIVITAMIN) tablet Take 1 tablet by mouth daily.   No facility-administered encounter medications on file as of 11/06/2022.    Allergies (verified) Patient has no known allergies.   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   Vocal cord paralysis    Past Surgical History:  Procedure Laterality Date   CESAREAN SECTION     x4   COLONOSCOPY     OOPHORECTOMY  N/A    schwannoma     Family History  Problem Relation Age of Onset   Heart disease Mother    Pneumonia Father    Colon cancer Sister 49   Colon polyps Neg Hx    Esophageal cancer Neg Hx    Stomach cancer Neg Hx    Rectal cancer Neg Hx    Social History   Socioeconomic History   Marital status: Married    Spouse name: Not on file   Number of children: Not on file   Years of education: Not on file   Highest education level: 12th grade  Occupational History   Not on file  Tobacco Use   Smoking status: Former    Current packs/day: 0.00    Types: Cigarettes    Quit date: 01/02/1984    Years since quitting: 38.8   Smokeless tobacco: Never  Vaping Use   Vaping status: Never Used  Substance and Sexual Activity   Alcohol use: Yes    Alcohol/week: 0.0 standard drinks of alcohol    Comment: socially- wine at night    Drug use: No   Sexual activity: Not Currently  Other Topics Concern   Not on file  Social History Narrative   Not on file   Social Determinants of Health   Financial Resource Strain: Low Risk  (11/06/2022)   Overall Financial Resource Strain (CARDIA)    Difficulty of Paying Living Expenses: Not hard at all  Food Insecurity: No Food Insecurity (11/06/2022)   Hunger  Vital Sign    Worried About Programme researcher, broadcasting/film/video in the Last Year: Never true    Ran Out of Food in the Last Year: Never true  Transportation Needs: No Transportation Needs (11/06/2022)   PRAPARE - Administrator, Civil Service (Medical): No    Lack of Transportation (Non-Medical): No  Physical Activity: Sufficiently Active (11/06/2022)   Exercise Vital Sign    Days of Exercise per Week: 5 days    Minutes of Exercise per Session: 30 min  Recent Concern: Physical Activity - Insufficiently Active (09/11/2022)   Exercise Vital Sign    Days of Exercise per Week: 7 days    Minutes of Exercise per Session: 20 min  Stress: No Stress Concern Present (11/06/2022)   [employer] of  Occupational Health - Occupational Stress Questionnaire    Feeling of Stress : Not at all  Social Connections: Moderately Isolated (11/06/2022)   Social Connection and Isolation Panel [NHANES]    Frequency of Communication with Friends and Family: Three times a week    Frequency of Social Gatherings with Friends and Family: Three times a week    Attends Religious Services: Never    Active Member of Clubs or Organizations: No    Attends Banker Meetings: Never    Marital Status: Married    Tobacco Counseling Counseling given: Not Answered   Clinical Intake:  Pre-visit preparation completed: Yes  Pain : No/denies pain     Diabetes: No     Interpreter Needed?: No  Information entered by :: Remi Haggard LPN   Activities of Daily Living    11/06/2022   11:51 AM 08/20/2022    1:44 PM  In your present state of health, do you have any difficulty performing the following activities:  Hearing? 0 0  Vision? 0 0  Difficulty concentrating or making decisions?  0  Walking or climbing stairs? 0 0  Dressing or bathing? 0 0  Doing errands, shopping? 0 0  Preparing Food and eating ? N   Using the Toilet? N   In the past six months, have you accidently leaked urine? N   Do you have problems with loss of bowel control? N   Managing your Medications? N   Managing your Finances? N   Housekeeping or managing your Housekeeping? N     Patient Care Team: Sheliah Hatch, MD as PCP - General (Family Medicine) Lynann Bologna, MD as Consulting Physician (Gastroenterology)  Indicate any recent Medical Services you may have received from other than [facility] providers in the past year (date may be approximate).     Assessment:   This is a routine wellness examination for Diane Stone.  Hearing/Vision screen Hearing Screening - Comments:: No trouble hearing Vision Screening - Comments:: Not up to date    Goals Addressed             This Visit's Progress    Exercise 3x per  week (30 min per time)   On track    Weight (lb) < 200 lb (90.7 kg)         Depression Screen    11/06/2022   11:54 AM 09/12/2022   10:53 AM 08/20/2022    1:44 PM 10/19/2021   10:50 AM 07/20/2021    8:30 AM 07/11/2020    9:58 AM 07/01/2019    3:00 PM  PHQ 2/9 Scores  PHQ - 2 Score 0 0 0 0 0 0 0  PHQ- 9 Score 0 0  0  0 0 0    Fall Risk    11/06/2022   12:00 PM 09/12/2022   10:53 AM 08/20/2022    1:44 PM 10/19/2021   10:49 AM 10/19/2021   10:34 AM  Fall Risk   Falls in the past year? 0 0 0 0 0  Number falls in past yr: 0 0 0 0   Injury with Fall? 0 0 0 0   Risk for fall due to :  No Fall Risks No Fall Risks No Fall Risks   Follow up Falls evaluation completed;Education provided;Falls prevention discussed Falls evaluation completed Falls evaluation completed Falls prevention discussed     MEDICARE RISK AT HOME: Medicare Risk at Home Any stairs in or around the home?: Yes If so, are there any without handrails?: No Home free of loose throw rugs in walkways, pet beds, electrical cords, etc?: Yes Adequate lighting in your home to reduce risk of falls?: Yes Life alert?: No Use of a cane, walker or w/c?: No Grab bars in the bathroom?: No Shower chair or bench in shower?: No Elevated toilet seat or a handicapped toilet?: No  TIMED UP AND GO:  Was the test performed?  No    Cognitive Function:        11/06/2022   11:54 AM 10/19/2021   10:54 AM  6CIT Screen  What Year? 0 points 0 points  What month? 0 points 0 points  What time? 0 points 0 points  Count back from 20 0 points 0 points  Months in reverse 0 points 0 points  Repeat phrase 0 points 0 points  Total Score 0 points 0 points    Immunizations Immunization History  Administered Date(s) Administered   Influenza,inj,Quad PF,6+ Mos 11/02/2014   Influenza-Unspecified 11/02/2014   PFIZER(Purple Top)SARS-COV-2 Vaccination 03/14/2019, 04/07/2019   Tdap 06/24/2017    TDAP status: Up to date  Flu Vaccine status:  Due, Education has been provided regarding the importance of this vaccine. Advised may receive this vaccine at local pharmacy or Health Dept. Aware to provide a copy of the vaccination record if obtained from local pharmacy or Health Dept. Verbalized acceptance and understanding.    Covid-19 vaccine status: Declined, Education has been provided regarding the importance of this vaccine but patient still declined. Advised may receive this vaccine at local pharmacy or Health Dept.or vaccine clinic. Aware to provide a copy of the vaccination record if obtained from local pharmacy or Health Dept. Verbalized acceptance and understanding.  Qualifies for Shingles Vaccine? Yes   Zostavax completed No   Shingrix Completed?: No.    Education has been provided regarding the importance of this vaccine. Patient has been advised to call insurance company to determine out of pocket expense if they have not yet received this vaccine. Advised may also receive vaccine at local pharmacy or Health Dept. Verbalized acceptance and understanding.  Screening Tests Health Maintenance  Topic Date Due   Colonoscopy  08/02/2020   INFLUENZA VACCINE  08/02/2022   MAMMOGRAM  07/24/2023   Medicare Annual Wellness (AWV)  11/06/2023   DTaP/Tdap/Td (2 - Td or Tdap) 06/25/2027   Cervical Cancer Screening (HPV/Pap Cotest)  09/12/2027   HPV VACCINES  Aged Out   COVID-19 Vaccine  Discontinued   Hepatitis C Screening  Discontinued   HIV Screening  Discontinued   Zoster Vaccines- Shingrix  Discontinued    Health Maintenance  Health Maintenance Due  Topic Date Due   Colonoscopy  08/02/2020   INFLUENZA VACCINE  08/02/2022  Colonoscopy  scheduled 11-19-2022   Mammogram   Completed     Lung Cancer Screening: (Low Dose CT Chest recommended if Age 7-80 years, 20 pack-year currently smoking OR have quit w/in 15years.) does not qualify.   Lung Cancer Screening Referral:   Additional Screening:  Hepatitis C Screening    never done  Vision Screening: Recommended annual ophthalmology exams for early detection of glaucoma and other disorders of the eye. Is the patient up to date with their annual eye exam?  No  Who is the provider or what is the name of the office in which the patient attends annual eye exams? Will schedule   Education provided If pt is not established with a provider, would they like to be referred to a provider to establish care? No .   Dental Screening: Recommended annual dental exams for proper oral hygiene   Community Resource Referral / Chronic Care Management: CRR required this visit?  No   CCM required this visit?  No     Plan:     I have personally reviewed and noted the following in the patient's chart:   Medical and social history Use of alcohol, tobacco or illicit drugs  Current medications and supplements including opioid prescriptions. Patient is not currently taking opioid prescriptions. Functional ability and status Nutritional status Physical activity Advanced directives List of other physicians Hospitalizations, surgeries, and ER visits in previous 12 months Vitals Screenings to include cognitive, depression, and falls Referrals and appointments  In addition, I have reviewed and discussed with patient certain preventive protocols, quality metrics, and best practice recommendations. A written personalized care plan for preventive services as well as general preventive health recommendations were provided to patient.     Remi Haggard, LPN   16/01/958   After Visit Summary: (MyChart) Due to this being a telephonic visit, the after visit summary with patients personalized plan was offered to patient via MyChart   Nurse Notes:

## 2022-11-12 ENCOUNTER — Ambulatory Visit (AMBULATORY_SURGERY_CENTER): Payer: Medicare HMO

## 2022-11-12 ENCOUNTER — Encounter: Payer: Self-pay | Admitting: Gastroenterology

## 2022-11-12 VITALS — Ht 64.0 in | Wt 137.4 lb

## 2022-11-12 DIAGNOSIS — Z800 Family history of malignant neoplasm of digestive organs: Secondary | ICD-10-CM

## 2022-11-12 NOTE — Progress Notes (Signed)
No egg or soy allergy known to patient  No issues known to pt with past sedation with any surgeries or procedures Patient denies ever being told they had issues or difficulty with intubation  No FH of Malignant Hyperthermia Pt is not on diet pills Pt is not on  home 02  Pt is not on blood thinners  Pt denies issues with constipation  No A fib or A flutter Have any cardiac testing pending--no  LOA: independent  Prep: 2 day clenpiq sample given at [facility] apt   Patient's chart reviewed by Cathlyn Parsons CNRA prior to previsit and patient appropriate for the [facility].  Previsit completed and red dot placed by patient's name on their procedure day (on provider's schedule).     PV competed with patient. Prep instructions given at [facility] APT

## 2022-11-15 ENCOUNTER — Encounter: Payer: Self-pay | Admitting: Certified Registered"

## 2022-11-19 ENCOUNTER — Telehealth: Payer: Self-pay | Admitting: Gastroenterology

## 2022-11-19 ENCOUNTER — Encounter: Payer: Medicare HMO | Admitting: Gastroenterology

## 2022-11-19 NOTE — Telephone Encounter (Signed)
Good Morning Dr. Chales Abrahams,   I called this patient this morning at 8:23 am and left a message on her voicemail to call us if she was running late or needed to rescheduled.  I will NO SHOW her.  [employer]

## 2023-04-15 ENCOUNTER — Ambulatory Visit
Admission: RE | Admit: 2023-04-15 | Discharge: 2023-04-15 | Disposition: A | Discharge status: Home / Self Care | Source: Ambulatory Visit | Attending: Family Medicine | Admitting: Family Medicine

## 2023-04-15 VITALS — BP 127/76 | HR 65 | Temp 97.6°F | Resp 16

## 2023-04-15 DIAGNOSIS — H6691 Otitis media, unspecified, right ear: Secondary | ICD-10-CM | POA: Diagnosis not present

## 2023-04-15 MED ORDER — AMOXICILLIN-POT CLAVULANATE 875-125 MG PO TABS: 1.0000 | ORAL_TABLET | Freq: Two times a day (BID) | ORAL | 0 refills | Status: AC

## 2023-04-15 NOTE — Discharge Instructions (Addendum)
 Advised patient to take medications as directed with food to completion.  Encouraged increase daily water intake to 64 ounces per day while taking this medication.  Advised if symptoms worsen and/or unresolved please follow-up with your PCP, ENT, or here for further evaluation.

## 2023-04-15 NOTE — ED Provider Notes (Signed)
 [facility]    CSN: [id number] Arrival date & time: 04/15/23  1836      History   Chief Complaint Chief Complaint  Patient presents with   Otalgia    HPI Diane Stone is a 63 y.o. female.   HPI 63 year old female presents with right ear pain since this morning reports 5 out of 10 pain.  PMH significant for stroke and vocal cord paralysis.  Patient is accompanied by her husband this evening.  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      Home Medications    Prior to Admission medications   Medication Sig Start Date End Date Taking? Authorizing Provider  amoxicillin-clavulanate (AUGMENTIN) 875-125 MG tablet Take 1 tablet by mouth 2 (two) times daily for 10 days. 04/15/23 04/25/23 Yes Trevor Iha, FNP  Multiple Vitamin (MULTIVITAMIN) tablet Take 1 tablet by mouth daily.    [provider]    Family History Family History  Problem Relation Age of Onset   Heart disease Mother    Pneumonia Father    Colon cancer Sister 27   Colon polyps Neg Hx    Esophageal cancer Neg Hx    Stomach cancer Neg Hx    Rectal cancer Neg Hx     Social History Social History   Tobacco Use   Smoking status: Former    Current packs/day: 0.00    Types: Cigarettes    Quit date: 01/02/1984    Years since quitting: 39.3   Smokeless tobacco: Never  Vaping Use    Vaping status: Never Used  Substance Use Topics   Alcohol use: Yes    Alcohol/week: 0.0 standard drinks of alcohol    Comment: socially- wine at night    Drug use: No     Allergies   Patient has no known allergies.   Review of Systems Review of Systems  HENT:  Positive for ear pain.   All other systems reviewed and are negative.    Physical Exam Triage Vital Signs ED Triage Vitals [04/15/23 1913]  Encounter Vitals Group     BP 127/76     Systolic BP Percentile      Diastolic BP Percentile      Pulse Rate 65     Resp 16     Temp 97.6 F (36.4 C)     Temp src      SpO2 98 %     Weight      Height      Head Circumference      Peak Flow      Pain Score 5     Pain Loc      Pain Education      Exclude from Growth Chart    No  data found.  Updated Vital Signs BP 127/76   Pulse 65   Temp 97.6 F (36.4 C)   Resp 16   LMP 04/09/2017   SpO2 98%    Physical Exam Vitals and nursing note reviewed.  Constitutional:      Appearance: Normal appearance. She is normal weight. She is ill-appearing.  HENT:     Head: Normocephalic and atraumatic.     Right Ear: Ear canal and external ear normal.     Left Ear: Tympanic membrane, ear canal and external ear normal.     Ears:     Comments: Right TM: Red rimmed, retracted    Mouth/Throat:     Mouth: Mucous membranes are moist.     Pharynx: Oropharynx is clear.  Eyes:     Extraocular Movements: Extraocular movements intact.     Conjunctiva/sclera: Conjunctivae normal.     Pupils: Pupils are equal, round, and reactive to light.  Cardiovascular:     Rate and Rhythm: Normal rate and regular rhythm.     Pulses: Normal pulses.     Heart sounds: Normal heart sounds.  Pulmonary:     Effort: Pulmonary effort is normal.     Breath sounds: Normal breath sounds. No wheezing, rhonchi or rales.  Chest:     Chest wall: No tenderness.  Musculoskeletal:        General: Normal range of motion.     Cervical back: Normal range of  motion and neck supple.  Skin:    General: Skin is warm and dry.  Neurological:     General: No focal deficit present.     Mental Status: She is alert and oriented to person, place, and time. Mental status is at baseline.  Psychiatric:        Mood and Affect: Mood normal.        Behavior: Behavior normal.        Thought Content: Thought content normal.      UC Treatments / Results  Labs (all labs ordered are listed, but only abnormal results are displayed) Labs Reviewed - No data to display  EKG   Radiology No results found.  Procedures Procedures (including critical care time)  Medications Ordered in UC Medications - No data to display  Initial Impression / Assessment and Plan / UC Course  I have reviewed the triage vital signs and the nursing notes.  Pertinent labs & imaging results that were available during my care of the patient were reviewed by me and considered in my medical decision making (see chart for details).     MDM: 1.  Acute right otitis media-Rx Augmentin 875/125 mg tablet: Take 1 tablet twice daily x 10 days. Advised patient to take medications as directed with food to completion.  Encouraged increase daily water intake to 64 ounces per day while taking this medication.  Advised if symptoms worsen and/or unresolved please follow-up with your PCP, ENT, or here for further evaluation. Final Clinical Impressions(s) / UC Diagnoses   Final diagnoses:  Acute right otitis media     Discharge Instructions      Advised patient to take medications as directed with food to completion.  Encouraged increase daily water intake to 64 ounces per day while taking this medication.  Advised if symptoms worsen and/or unresolved please follow-up with your PCP, ENT, or here for further evaluation.     ED Prescriptions     Medication Sig Dispense Auth. Provider   amoxicillin-clavulanate (AUGMENTIN) 875-125 MG tablet Take  1 tablet by mouth 2 (two) times daily for 10  days. 20 tablet Akai Dollard, FNP      PDMP not reviewed this encounter.   Leonides Ramp, FNP 04/15/23 1943

## 2023-04-15 NOTE — ED Triage Notes (Signed)
 Pt presents to uc with co otalgia on right side since this morning 5/10. No otc. Denies  cough congestion ha.

## 2023-04-16 ENCOUNTER — Ambulatory Visit

## 2023-08-21 ENCOUNTER — Encounter: Payer: Medicare HMO | Admitting: Family Medicine

## 2023-09-23 ENCOUNTER — Encounter: Payer: Medicare HMO | Admitting: Family Medicine

## 2023-12-17 ENCOUNTER — Ambulatory Visit

## 2024-01-08 ENCOUNTER — Ambulatory Visit

## 2024-03-05 ENCOUNTER — Encounter: Admitting: Family Medicine
